# Patient Record
Sex: Female | Born: 1960 | Marital: Married | Smoking: Never smoker
Health system: Southern US, Community
[De-identification: ages and names within clinical notes are randomized; demographics above are authoritative.]

## PROBLEM LIST (undated history)

## (undated) DIAGNOSIS — E213 Hyperparathyroidism, unspecified: Secondary | ICD-10-CM

## (undated) DIAGNOSIS — G8929 Other chronic pain: Secondary | ICD-10-CM

## (undated) DIAGNOSIS — F32A Depression, unspecified: Secondary | ICD-10-CM

## (undated) DIAGNOSIS — K219 Gastro-esophageal reflux disease without esophagitis: Secondary | ICD-10-CM

## (undated) DIAGNOSIS — N809 Endometriosis, unspecified: Secondary | ICD-10-CM

## (undated) DIAGNOSIS — M1990 Unspecified osteoarthritis, unspecified site: Secondary | ICD-10-CM

## (undated) DIAGNOSIS — I10 Essential (primary) hypertension: Secondary | ICD-10-CM

## (undated) DIAGNOSIS — E669 Obesity, unspecified: Secondary | ICD-10-CM

## (undated) DIAGNOSIS — E119 Type 2 diabetes mellitus without complications: Secondary | ICD-10-CM

## (undated) DIAGNOSIS — M792 Neuralgia and neuritis, unspecified: Secondary | ICD-10-CM

## (undated) DIAGNOSIS — E7800 Pure hypercholesterolemia, unspecified: Secondary | ICD-10-CM

## (undated) DIAGNOSIS — C801 Malignant (primary) neoplasm, unspecified: Secondary | ICD-10-CM

## (undated) DIAGNOSIS — F419 Anxiety disorder, unspecified: Secondary | ICD-10-CM

## (undated) DIAGNOSIS — T8859XA Other complications of anesthesia, initial encounter: Secondary | ICD-10-CM

## (undated) DIAGNOSIS — T4145XA Adverse effect of unspecified anesthetic, initial encounter: Secondary | ICD-10-CM

## (undated) DIAGNOSIS — J45909 Unspecified asthma, uncomplicated: Secondary | ICD-10-CM

## (undated) DIAGNOSIS — R102 Pelvic and perineal pain: Secondary | ICD-10-CM

## (undated) HISTORY — DX: Type 2 diabetes mellitus without complications: E11.9

## (undated) HISTORY — PX: FOOT SURGERY: SHX648

## (undated) HISTORY — DX: Neuralgia and neuritis, unspecified: M79.2

## (undated) HISTORY — DX: Pelvic and perineal pain: R10.2

## (undated) HISTORY — PX: LAPAROSCOPIC LYSIS OF ADHESIONS: SHX5905

## (undated) HISTORY — PX: SALPINGECTOMY: SHX328

## (undated) HISTORY — PX: BREAST BIOPSY: SHX20

## (undated) HISTORY — PX: WISDOM TOOTH EXTRACTION: SHX21

## (undated) HISTORY — DX: Other chronic pain: G89.29

## (undated) HISTORY — PX: LAPAROTOMY: SHX154

## (undated) HISTORY — DX: Essential (primary) hypertension: I10

## (undated) HISTORY — DX: Obesity, unspecified: E66.9

## (undated) HISTORY — DX: Endometriosis, unspecified: N80.9

---

## 1999-11-06 ENCOUNTER — Encounter: Admission: RE | Admit: 1999-11-06 | Discharge: 1999-11-06 | Payer: Self-pay | Admitting: Obstetrics and Gynecology

## 1999-11-06 ENCOUNTER — Encounter: Payer: Self-pay | Admitting: Obstetrics and Gynecology

## 2000-08-11 ENCOUNTER — Ambulatory Visit (HOSPITAL_COMMUNITY): Admission: RE | Admit: 2000-08-11 | Discharge: 2000-08-11 | Payer: Self-pay

## 2000-11-15 ENCOUNTER — Other Ambulatory Visit: Admission: RE | Admit: 2000-11-15 | Discharge: 2000-11-15 | Payer: Self-pay | Admitting: Obstetrics and Gynecology

## 2000-11-29 ENCOUNTER — Encounter (INDEPENDENT_AMBULATORY_CARE_PROVIDER_SITE_OTHER): Payer: Self-pay | Admitting: Specialist

## 2000-11-29 ENCOUNTER — Inpatient Hospital Stay (HOSPITAL_COMMUNITY): Admission: RE | Admit: 2000-11-29 | Discharge: 2000-12-02 | Payer: Self-pay | Admitting: Obstetrics and Gynecology

## 2002-02-01 ENCOUNTER — Other Ambulatory Visit: Admission: RE | Admit: 2002-02-01 | Discharge: 2002-02-01 | Payer: Self-pay | Admitting: Obstetrics and Gynecology

## 2003-04-25 ENCOUNTER — Other Ambulatory Visit: Admission: RE | Admit: 2003-04-25 | Discharge: 2003-04-25 | Payer: Self-pay | Admitting: Obstetrics and Gynecology

## 2004-05-21 ENCOUNTER — Other Ambulatory Visit: Admission: RE | Admit: 2004-05-21 | Discharge: 2004-05-21 | Payer: Self-pay | Admitting: Obstetrics and Gynecology

## 2009-05-28 ENCOUNTER — Ambulatory Visit: Payer: Self-pay | Admitting: Obstetrics & Gynecology

## 2009-05-28 ENCOUNTER — Inpatient Hospital Stay (HOSPITAL_COMMUNITY): Admission: EM | Admit: 2009-05-28 | Discharge: 2009-05-31 | Payer: Self-pay | Admitting: Emergency Medicine

## 2009-05-29 ENCOUNTER — Encounter (INDEPENDENT_AMBULATORY_CARE_PROVIDER_SITE_OTHER): Payer: Self-pay | Admitting: Emergency Medicine

## 2010-02-09 ENCOUNTER — Encounter: Payer: Self-pay | Admitting: Obstetrics & Gynecology

## 2010-02-09 ENCOUNTER — Encounter: Payer: Self-pay | Admitting: Obstetrics and Gynecology

## 2010-02-19 ENCOUNTER — Ambulatory Visit: Payer: Self-pay | Admitting: Family Medicine

## 2010-03-20 ENCOUNTER — Ambulatory Visit: Payer: Self-pay | Admitting: Family Medicine

## 2010-04-08 LAB — DIFFERENTIAL
Basophils Absolute: 0.1 10*3/uL (ref 0.0–0.1)
Eosinophils Absolute: 0.3 10*3/uL (ref 0.0–0.7)
Eosinophils Absolute: 0.3 10*3/uL (ref 0.0–0.7)
Eosinophils Absolute: 0.3 10*3/uL (ref 0.0–0.7)
Eosinophils Relative: 2 % (ref 0–5)
Lymphocytes Relative: 28 % (ref 12–46)
Lymphs Abs: 3.4 10*3/uL (ref 0.7–4.0)
Lymphs Abs: 3.1 10*3/uL (ref 0.7–4.0)
Lymphs Abs: 3.6 10*3/uL (ref 0.7–4.0)
Monocytes Relative: 6 % (ref 3–12)
Monocytes Relative: 7 % (ref 3–12)
Neutro Abs: 8.4 10*3/uL — ABNORMAL HIGH (ref 1.7–7.7)
Neutro Abs: 6.7 10*3/uL (ref 1.7–7.7)
Neutrophils Relative %: 65 % (ref 43–77)
Neutrophils Relative %: 61 % (ref 43–77)

## 2010-04-08 LAB — CARDIAC PANEL(CRET KIN+CKTOT+MB+TROPI)
CK, MB: 4.2 ng/mL — ABNORMAL HIGH (ref 0.3–4.0)
Total CK: 1766 U/L — ABNORMAL HIGH (ref 7–177)
Troponin I: 0.01 ng/mL (ref 0.00–0.06)

## 2010-04-08 LAB — BASIC METABOLIC PANEL
BUN: 13 mg/dL (ref 6–23)
Creatinine, Ser: 0.86 mg/dL (ref 0.4–1.2)
GFR calc non Af Amer: >60 mL/min (ref >60)
Glucose, Bld: 152 mg/dL — ABNORMAL HIGH (ref 70–99)
Potassium: 3.7 mEq/L (ref 3.5–5.1)

## 2010-04-08 LAB — CBC
HCT: 30.8 % — ABNORMAL LOW (ref 36.0–46.0)
HCT: 32.1 % — ABNORMAL LOW (ref 36.0–46.0)
HCT: 35.2 % — ABNORMAL LOW (ref 36.0–46.0)
Hemoglobin: 12.5 g/dL (ref 12.0–15.0)
Hemoglobin: 11.2 g/dL — ABNORMAL LOW (ref 12.0–15.0)
Hemoglobin: 12.1 g/dL (ref 12.0–15.0)
MCHC: 34.5 g/dL (ref 30.0–36.0)
MCHC: 35 g/dL (ref 30.0–36.0)
MCV: 96 fL (ref 78.0–100.0)
MCV: 96.3 fL (ref 78.0–100.0)
MCV: 96.3 fL (ref 78.0–100.0)
MCV: 96.8 fL (ref 78.0–100.0)
Platelets: 260 10*3/uL (ref 150–400)
Platelets: 191 10*3/uL (ref 150–400)
RBC: 3.78 MIL/uL — ABNORMAL LOW (ref 3.87–5.11)
RBC: 3.33 MIL/uL — ABNORMAL LOW (ref 3.87–5.11)
RDW: 13.7 % (ref 11.5–15.5)
WBC: 13 10*3/uL — ABNORMAL HIGH (ref 4.0–10.5)
WBC: 10.9 10*3/uL — ABNORMAL HIGH (ref 4.0–10.5)
WBC: 12.7 10*3/uL — ABNORMAL HIGH (ref 4.0–10.5)

## 2010-04-08 LAB — GLUCOSE, CAPILLARY
Glucose-Capillary: 250 mg/dL — ABNORMAL HIGH (ref 70–99)
Glucose-Capillary: 143 mg/dL — ABNORMAL HIGH (ref 70–99)
Glucose-Capillary: 151 mg/dL — ABNORMAL HIGH (ref 70–99)
Glucose-Capillary: 204 mg/dL — ABNORMAL HIGH (ref 70–99)

## 2010-04-08 LAB — COMPREHENSIVE METABOLIC PANEL
AST: 48 U/L — ABNORMAL HIGH (ref 0–37)
Albumin: 3.2 g/dL — ABNORMAL LOW (ref 3.5–5.2)
Alkaline Phosphatase: 52 U/L (ref 39–117)
BUN: 17 mg/dL (ref 6–23)
BUN: 22 mg/dL (ref 6–23)
CO2: 26 mEq/L (ref 19–32)
CO2: 27 mEq/L (ref 19–32)
Calcium: 9.4 mg/dL (ref 8.4–10.5)
Chloride: 100 mEq/L (ref 96–112)
Chloride: 105 mEq/L (ref 96–112)
Creatinine, Ser: 0.89 mg/dL (ref 0.4–1.2)
GFR calc Af Amer: 46 mL/min — ABNORMAL LOW (ref >60)
Glucose, Bld: 158 mg/dL — ABNORMAL HIGH (ref 70–99)
Potassium: 3.9 mEq/L (ref 3.5–5.1)
Sodium: 138 mEq/L (ref 135–145)
Total Bilirubin: 0.7 mg/dL (ref 0.3–1.2)
Total Protein: 6.6 g/dL (ref 6.0–8.3)

## 2010-04-08 LAB — URINALYSIS, ROUTINE W REFLEX MICROSCOPIC
Bilirubin Urine: NEGATIVE
Hgb urine dipstick: NEGATIVE
Ketones, ur: NEGATIVE mg/dL
Nitrite: NEGATIVE
Protein, ur: NEGATIVE mg/dL
Specific Gravity, Urine: 1.013 (ref 1.005–1.030)
pH: 5 (ref 5.0–8.0)

## 2010-04-08 LAB — TROPONIN I
Troponin I: 0.07 ng/mL — ABNORMAL HIGH (ref 0.00–0.06)
Troponin I: 0.02 ng/mL (ref 0.00–0.06)

## 2010-04-08 LAB — CULTURE, BLOOD (ROUTINE X 2): Culture: NO GROWTH

## 2010-04-08 LAB — APTT: aPTT: 35 seconds (ref 24–37)

## 2010-04-08 LAB — RAPID URINE DRUG SCREEN, HOSP PERFORMED
Barbiturates: NOT DETECTED
Benzodiazepines: POSITIVE — AB

## 2010-04-08 LAB — CK TOTAL AND CKMB (NOT AT ARMC)
CK, MB: 9.2 ng/mL (ref 0.3–4.0)
CK, MB: 7 ng/mL (ref 0.3–4.0)
Relative Index: 0.4 (ref 0.0–2.5)
Relative Index: 0.3 (ref 0.0–2.5)
Relative Index: 0.3 (ref 0.0–2.5)
Total CK: 2531 U/L — ABNORMAL HIGH (ref 7–177)
Total CK: 2217 U/L — ABNORMAL HIGH (ref 7–177)

## 2010-04-08 LAB — LIPID PANEL
Cholesterol: 137 mg/dL (ref 0–200)
HDL: 36 mg/dL — ABNORMAL LOW (ref >39)
LDL Cholesterol: 54 mg/dL (ref 0–99)
Triglycerides: 235 mg/dL — ABNORMAL HIGH (ref <150)

## 2010-04-08 LAB — PROTIME-INR
INR: 1.09 (ref 0.00–1.49)
INR: 1.14 (ref 0.00–1.49)

## 2010-04-08 LAB — LACTIC ACID, PLASMA: Lactic Acid, Venous: 2.5 mmol/L — ABNORMAL HIGH (ref 0.5–2.2)

## 2010-04-08 LAB — MRSA PCR SCREENING: MRSA by PCR: NEGATIVE

## 2010-04-08 LAB — HEMOGLOBIN A1C
Hgb A1c MFr Bld: 7.4 % — ABNORMAL HIGH (ref <5.7)
Mean Plasma Glucose: 166 mg/dL — ABNORMAL HIGH (ref <117)

## 2010-04-08 LAB — MAGNESIUM: Magnesium: 1.5 mg/dL (ref 1.5–2.5)

## 2010-04-08 LAB — CORTISOL: Cortisol, Plasma: 3.7 ug/dL

## 2010-04-08 LAB — TSH: TSH: 1.024 u[IU]/mL (ref 0.350–4.500)

## 2010-04-17 ENCOUNTER — Ambulatory Visit: Payer: Self-pay | Admitting: Physical Medicine and Rehabilitation

## 2010-04-20 ENCOUNTER — Ambulatory Visit: Payer: Self-pay | Admitting: Family Medicine

## 2010-05-22 ENCOUNTER — Other Ambulatory Visit: Payer: Self-pay | Admitting: Obstetrics and Gynecology

## 2010-05-22 DIAGNOSIS — Z1231 Encounter for screening mammogram for malignant neoplasm of breast: Secondary | ICD-10-CM

## 2010-05-26 ENCOUNTER — Ambulatory Visit: Payer: Self-pay

## 2010-06-06 NOTE — Op Note (Signed)
[facility]  Patient:    ISELLA, SLATTEN Visit Number: [id number] MRN: [id number]          Service Type: GYN Location: 9300 [id number] Attending Physician:  Esmeralda Arthur Dictated by:   Silverio Lay, M.D. Proc. Date: 11/29/00 Admit Date:  11/29/2000                             Operative Report  PREOPERATIVE DIAGNOSES:       1. Chronic pelvic pain.                               2. Severe abdominopelvic adhesion.                               3. Left hydrosalpinx.  POSTOPERATIVE DIAGNOSES:      1. Chronic pelvic pain.                               2. Severe abdominopelvic adhesion.                               3. Left ovarian cyst.  ANESTHESIA:                   General.  PROCEDURE:                    1. Exploratory laparotomy with lysis of                                  adhesions.                               2. Left ovarian cystectomy.                               3. Uterosacral nerve ablation.  SURGEON:                      Silverio Lay, M.D.  ASSISTANT:                    Pershing Cox, M.D.  ESTIMATED BLOOD LOSS:         200 cc.  DESCRIPTION OF PROCEDURE:     After being informed of the planned procedure with possible complications, including bleeding, infection, injury to bowels, bladder, or ureter, recurrent pain due to reformation of adhesions, and possible loss of her left adnexa, an informed consent was obtained.  The patient was taken to the operating room #3 and given general anesthesia with endotracheal intubation.  She was prepped and draped in a sterile fashion and a Foley catheter was inserted.  We proceeded with a midline incision overlooking the previous midline incision with knife to fascia.  The fascia was then opened first with the knife, then with the Mayo scissors.  At that time we encountered severe adhesions with the abdominal wall, which were sharply dissected until we could enter the abdominopelvic  cavity.  Overall about one hour was spent doing  lysis of adhesions, to have access to the cavity.  Self-retained retractors were placed and the bowel was retracted with abdominal lap.  Observation of the pelvis reveals a free anterior cul-de-sac, normal-appearing uterus, and free posterior cul-de-sac.  The right ovary is normal.  The right tube shows a partial middle part salpingectomy with a normal-appearing fimbria.  The left adnexa is completely involved in severe adhesion with the bowel, which we now sharply dissect, to free a tube which is diseased, folded on itself many times, and now a right ovary that contains an ovarian cyst measuring about 4.5 cm.  This cyst is adherent to the left pelvic wall and to the posterior cul-de-sac, and we are able to completely free it with sharp dissection.  The ureter is then visualized and mobile.  The cyst is opened, resected, and the capsule is removed.  The interior lining is cauterized, and the ovary is closed with a baseball stitch of #3-0 Vicryl. Hemostasis is adequate.  We then put our attention on the left tube, and tried to open the left tube, which is not dilated, on two occasions; but those occasions failed to reveal a lumen.  Some bleeding on the mesosalpinx is controlled with two figure-of-eight stitch of #3-0 Vicryl.  Using a fork and spoon clamp, we are able to mobilize the uterus upward, and to identify both uterosacral ligaments which are then sutured with a #0 Vicryl suture, staying midline and having the ureters under direct visualization.  Those sutures allow [place] to section the uterosacral ligaments using cautery.  Hemostasis is adequate.  Sutures are then sectioned.  We then irrigate the pelvis with warm saline and noted an adequate hemostasis.  We reverify hemostasis on the omentum which was greatly adherent to the abdominal wall and three sites are cauterized.  We then follow the small bowel and I will spend another 30 minutes  sharply dissecting adhesion and returning bowel to its normal course. We irrigate with warm saline.  Hemostasis is adequate.  A red Foley catheter is placed in the posterior cul-de-sac to outside, to allow [place] later to infuse the Inter-Gel.  The under-fasca hemostasis is then completed with cautery and the fascia is closed with interrupted sutures of #0 Vicryl.  We now infuse 300 cc of Inter-Gel two-thirds of which is via the red catheter in the posterior cul-de-sac, and one-third via the small catheter that comes with the Inter-Gel, to leave in the abdominal cavity.  We irrigate the fat and complete the hemostasis with cautery.  The skin is closed with staples.  The instrument and sponge counts are complete x 2.  The estimated blood loss was 200 cc.  The procedure is very well tolerated by the patient, who is taken to the recovery room in a well and stable condition. Dictated by:   Silverio Lay, M.D. Attending Physician:  Esmeralda Arthur DD:  11/29/00 TD:  11/29/00 Job: [id number]

## 2010-06-06 NOTE — H&P (Signed)
[facility]  Patient:    Gina Hill, Gina Hill Visit Number: [id number] MRN: [id number]          Service Type: Attending:  Silverio Lay, M.D. Dictated by:   Silverio Lay, M.D. Adm. Date:  11/29/00                           History and Physical  REASON FOR ADMISSION:         Chronic pelvic pain, severe pelvic adhesion syndrome.  HISTORY OF PRESENT ILLNESS:   This is a 50 year old, married black female, gravida 5, para 3, abortus 2, who has been followed by me since September 2001 for chronic pelvic pain and metrorrhagia. In July 2002, a diagnostic laparoscopy was performed but needed to be aborted due to the non feasibility of such procedure, patient having a completely obliterated pelvis with severe adhesions.  PAST SURGICAL HISTORY:        She is known for a previous laparotomy in September of 1998 with lysis of adhesions, bilateral tubal ligation ovarian lysis of adhesions and left fibroplasty with a diagnosis at that time of chronic PID. In January of 1999 she had partial right salpingectomy with laparotomy for ectopic pregnancy. In April of 1999 a hysterogram revealed bilateral hydrosalpinx. 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 She has undergone a trial of continuous birth control pills twice as well as Depo-Provera 150 mg monthly; last injection received in April 2002 and despite amenorrhea, pelvic pain remains the same. She describes it as almost constant pain which is increased with movement or physical activity, overall low abdomen of a severe intensity radiating to her legs and limiting her daily activities. It is also associated with dyspareunia.  In April of 2002, she went for consultation with Dr. ______ at [facility], who recommended three surgical options: Laparoscopy with  lysis of adhesions, laparoscopy with bilateral salpingectomy and right ovariectomy or laparoscopy with supracervical hysterectomy and BSO. The patient declines all options wanting to keep her organs and desiring not to go any ablation. She has had to use pain medication on a regular basis and is currently using Tylox b.i.d., Elavil 75 mg h.s., Paxil 60 mg q.d., and Zyprexa 2.5 mg q.d.  Due to the chronic nature of her pain and her desire to keep all her organs, we are consenting her today for exploratory laparotomy, lysis of adhesions with cure of left hydrosalpinx.  REVIEW OF SYSTEMS:            Shows increased weight in the last year of 60 pounds. HEENT: Eyes: Negative. Ear, nose, throat and mouth negative. CARDIOVASCULAR: Negative. Some shortness of breath with movement. Some nausea, vomiting and constipation with intermittent diarrhea. GENITOURINARY: Pain with intercourse. MUSCULOSKELETAL: Some joint pain. SKIN: Negative. BREASTS: Negative. NEUROLOGICAL: Migraine headaches. PSYCHIATRIC: Normal. ENDOCRINE: Hot flashes.  ALLERGIES:                    CODEINE.  PAST MEDICAL HISTORY:         Two spontaneous vaginal deliveries, one C-section, one D&C, one right partial salpingectomy in 1999 for ectopic pregnancy. Laparoscopy with lysis of adhesions in 1999. She has used an IUD from age 53 to 39.  SOCIAL HISTORY:  Married. Non smoker. She is a Futures trader.  FAMILY HISTORY:               Aunt with breast cancer in her late 37s.  Mother with hypertension and cardiovascular disease. Father with diabetes and prostate cancer.  PHYSICAL EXAMINATION:  VITAL SIGNS:                  Measuring 5 feet 7 inches, current weight is 241.8 pounds.  GENERAL:                      Well-developed, well-nourished, normal habitus.  NECK:                         Negative.  RESPIRATORY:                  Auscultation normal.  CARDIOVASCULAR:               Auscultation normal.  ABDOMEN:                       Distended but soft. No guarding. No rebound. Absence of hernia or hepatosplenomegaly.  LYMPH NODES:                  Negative in neck, axilla and groin area.  SKIN:                         Normal.  NEUROLOGICAL:                 The patient is well oriented in time, place and person with normal mood and affect.  BREASTS:                      Examination is normal.  GYNECOLOGIC:                  Examination reveals normal external genitalia and vaginal. Normal cervix, anterverted uterus which is slightly tender. Two adnexa which are mildly tender and difficulty to mobilize. Urethra is normal. Bladder is normal. Peritoneum is normal.  ASSESSMENT:                   Chronic pelvic pain and a woman known for severe pelvic adhesion and left hydrosalpinx requesting to preserve all her organs.  PLAN:                         The patient has been consented for exploratory laparotomy, lysis of adhesions, cure of left hydrosalpinx. This will be done through a midline incision. She is well aware of the risks of bowel injury, bladder injury, ureter injury, and will undergo complete bowel prep prior to surgery. She is also aware that lysis of adhesions is never guaranteed and due to the exploratory laparotomy may develop other adhesions but still declines permanent surgery. She is also aware that she may loose a tube or an ovary in the process of lysis of adhesions. Informed consent is obtained. Dictated by:   Silverio Lay, M.D. Attending:  Silverio Lay, M.D. DD:  11/22/00 TD:  11/23/00 Job: [id number]

## 2010-06-06 NOTE — Discharge Summary (Signed)
[facility]  Patient:    Gina Hill, GREENBAUM Visit Number: [id number] MRN: [id number]          Service Type: GYN Location: 9300 [id number] Attending Physician:  Esmeralda Arthur Dictated by:   Silverio Lay, M.D. Admit Date:  11/29/2000 Discharge Date: 12/02/2000                             Discharge Summary  REASON FOR ADMISSION:         Chronic pelvic pain, severe pelvic adhesion syndrome.  HISTORY OF PRESENT ILLNESS AND HOSPITAL COURSE:          This is a 50 year old married, black female, gravida 5, para 3, abortus 2, who had been followed by me since September 2001 for chronic pelvic pain and metrorrhagia. In July of 2001, a diagnostic laparoscopy had to be aborted due to the nonfeasibility of such procedure, pat having a completely obliterated pelvis with severe adhesions. On ultrasound evaluation preadmission, she was showing a left hydrosalpinx.  She was taken to the operating room and underwent exploratory laparotomy, complete lysis of adhesions, left ovarian cystectomy and uterosacral nerve ablation without any complication. Her operative estimated blood loss was 200 cc.  Pathology report from the ovarian cyst revealed a benign follicular cyst with fibrous adhesions.  Her postoperative course was uneventful. Postoperative hemoglobin remained stable at 8.4 and pain was very well controlled. On postoperative day #3, she reported normal bowel movement with passing gas. Diet was well tolerated and she was discharged home in a well and stable condition.  DISCHARGE MEDICATIONS:        1. Tylox one tablet every four to six hour                                  p.r.n., 30 tablets, no refills.                               2. Motrin 600 mg q.i.d. p.r.n., 30 tablets,                                  one refill.  DISCHARGE INSTRUCTIONS:       The patient was instructed to call if experiencing fever or increased pain.  DISCHARGE FOLLOWUP:            The patient will come back in the office on December 06, 2000 for staple removal and in four to six weeks for complete postoperative evaluation.  FINAL DIAGNOSES:              1. Chronic pelvic pain.                               2. Severe abdominopelvic adhesions.                               3. Left ovarian cyst.                               4. Status post exploratory laparotomy, lysis of  adhesions, left ovarian cystectomy and                                  uterosacral nerve ablation.  CONDITION AT DISCHARGE:       Well and stable. Dictated by:   Silverio Lay, M.D. Attending Physician:  Esmeralda Arthur DD:  12/15/00 TD:  12/16/00 Job: [id number]

## 2012-02-24 ENCOUNTER — Ambulatory Visit: Payer: Self-pay | Admitting: Obstetrics and Gynecology

## 2012-02-26 ENCOUNTER — Encounter: Payer: Self-pay | Admitting: Obstetrics and Gynecology

## 2012-02-26 ENCOUNTER — Ambulatory Visit: Payer: BC Managed Care – PPO | Admitting: Obstetrics and Gynecology

## 2012-02-26 VITALS — BP 128/90 | Ht 67.0 in | Wt 257.0 lb

## 2012-02-26 DIAGNOSIS — N809 Endometriosis, unspecified: Secondary | ICD-10-CM | POA: Insufficient documentation

## 2012-02-26 DIAGNOSIS — R102 Pelvic and perineal pain: Secondary | ICD-10-CM | POA: Insufficient documentation

## 2012-02-26 DIAGNOSIS — E119 Type 2 diabetes mellitus without complications: Secondary | ICD-10-CM | POA: Insufficient documentation

## 2012-02-26 DIAGNOSIS — I10 Essential (primary) hypertension: Secondary | ICD-10-CM | POA: Insufficient documentation

## 2012-02-26 DIAGNOSIS — R109 Unspecified abdominal pain: Secondary | ICD-10-CM

## 2012-02-26 DIAGNOSIS — Z124 Encounter for screening for malignant neoplasm of cervix: Secondary | ICD-10-CM

## 2012-02-26 DIAGNOSIS — Z01419 Encounter for gynecological examination (general) (routine) without abnormal findings: Secondary | ICD-10-CM

## 2012-02-26 DIAGNOSIS — G8929 Other chronic pain: Secondary | ICD-10-CM | POA: Insufficient documentation

## 2012-02-26 DIAGNOSIS — E669 Obesity, unspecified: Secondary | ICD-10-CM | POA: Insufficient documentation

## 2012-02-26 LAB — FOLLICLE STIMULATING HORMONE: FSH: 48.1 m[IU]/mL

## 2012-02-26 MED ORDER — OXYCODONE-ACETAMINOPHEN 5-500 MG PO CAPS: 1.0000 | ORAL_CAPSULE | ORAL | Status: DC | PRN

## 2012-02-26 MED ORDER — AMITRIPTYLINE HCL 75 MG PO TABS: 75.0000 mg | ORAL_TABLET | Freq: Every day | ORAL | Status: DC

## 2012-02-26 NOTE — Progress Notes (Signed)
The patient reports:has been cramping lately    Contraception:None  Last mammogram: 05/27/2004 Last pap: 05/22/2010 Normal  GC/Chlamydia cultures offered: declined HIV/RPR/HbsAg offered:  declined HSV 1 and 2 glycoprotein offered: declined  Menstrual cycle regular and monthly: No per pt does not recall last cycle  Menstrual flow normal:No   Urinary symptoms: urinary frequency Normal bowel movements: Yes Reports abuse at home: No:   Subjective:    Gina Hill is a 52 y.o. female, G4P3, who presents for an annual exam.     History   Social History  . Marital Status: Single    Spouse Name: N/A    Number of Children: N/A  . Years of Education: N/A   Social History Main Topics  . Smoking status: Not on file  . Smokeless tobacco: Not on file  . Alcohol Use: Not on file  . Drug Use: Not on file  . Sexually Active: Not on file   Other Topics Concern  . Not on file   Social History Narrative  . No narrative on file    Menstrual cycle:   LMP: No LMP recorded.           Cycle: amenorrhea, pt does c/o having menstrual cramping. States that the more movement she does the more pain she has. States that pain sits in the lower back and hip.   The following portions of the patient's history were reviewed and updated as appropriate: allergies, current medications, past family history, past medical history, past social history, past surgical history and problem list.  Review of Systems Pertinent items are noted in HPI. Breast:Negative for breast lump,nipple discharge or nipple retraction Gastrointestinal: Negative for abdominal pain, change in bowel habits or rectal bleeding Urinary:negative   Objective:    BP 128/90  Ht 5\' 7"  (1.702 m)  Wt 257 lb (116.574 kg)  BMI 40.25 kg/m2    Weight:  Wt Readings from Last 1 Encounters:  No data found for Wt          BMI: Body mass index is 40.25 kg/(m^2).  General Appearance: Alert, appropriate appearance for age. No acute  distress HEENT: Grossly normal Neck / Thyroid: Supple, no masses, nodes or enlargement Lungs: clear to auscultation bilaterally Back: No CVA tenderness Breast Exam: No masses or nodes.No dimpling, nipple retraction or discharge. Cardiovascular: Regular rate and rhythm. S1, S2, no murmur Gastrointestinal: Soft, tender, no masses or organomegaly diffusely tender as usual Pelvic Exam: Vulva and vagina appear normal. Bimanual exam reveals normal uterus and adnexa. Rectovaginal: normal rectal, no masses Lymphatic Exam: Non-palpable nodes in neck, clavicular, axillary, or inguinal regions  Skin: no rash or abnormalities Neurologic: Normal gait and speech, no tremor  Psychiatric: Alert and oriented, appropriate affect.    Assessment:    Normal gyn exam  Amenorrhea: menopause? FSH 2013=13.3 New onset of cramping   Plan:    pap smear  STD screening: declined Contraception:no method Cranberry Supplement Recommended  Mammogram Recommended / Pt declines  Transvaginal U/S for Abdominal Cramping @ [facility] Refilled Tylox and Flexeril   Silverio Lay MD

## 2012-02-29 LAB — PAP IG W/ RFLX HPV ASCU

## 2012-03-30 ENCOUNTER — Ambulatory Visit: Payer: BC Managed Care – PPO

## 2012-03-30 ENCOUNTER — Ambulatory Visit: Payer: BC Managed Care – PPO | Admitting: Obstetrics and Gynecology

## 2012-03-30 ENCOUNTER — Encounter: Payer: Self-pay | Admitting: Obstetrics and Gynecology

## 2012-03-30 ENCOUNTER — Other Ambulatory Visit: Payer: Self-pay | Admitting: Obstetrics and Gynecology

## 2012-03-30 VITALS — BP 132/80 | Temp 98.6°F | Resp 18 | Wt 260.0 lb

## 2012-03-30 DIAGNOSIS — R109 Unspecified abdominal pain: Secondary | ICD-10-CM

## 2012-03-30 DIAGNOSIS — N949 Unspecified condition associated with female genital organs and menstrual cycle: Secondary | ICD-10-CM

## 2012-03-30 NOTE — Progress Notes (Signed)
  Subjective:    Gina Hill is a 52 y.o. female, [phone], who presents for Gyn ultrasound because of CPP more like menstrual cramping despite no cycles.  The following portions of the patient's history were reviewed and updated as appropriate: allergies, current medications, past family history.  Objective:    BP 132/80  Temp(Src) 98.6 F (37 C) (Oral)  Resp 18  Wt 260 lb (117.935 kg)  BMI 40.71 kg/m2    Weight:  Wt Readings from Last 1 Encounters:  03/30/12 260 lb (117.935 kg)          BMI: Body mass index is 40.71 kg/(m^2).  ULTRASOUND: Uterus 7.8x6x6    Adnexa normal x2    Endometrium 4 mm    Free fluid: no    Other findings:  Fibroids anterior 7 cm (was 5 cm in 2007) FSH: 48  C/w menopause   Assessment:    Normal ovaries: reassurance  Uterine fibroid - known FSH c/w menopause   Plan:    Follow-up AEX  Silverio Lay MD

## 2013-01-09 ENCOUNTER — Ambulatory Visit: Payer: Self-pay | Admitting: Podiatry

## 2013-01-20 ENCOUNTER — Encounter: Payer: Self-pay | Admitting: Podiatry

## 2013-01-23 ENCOUNTER — Ambulatory Visit (INDEPENDENT_AMBULATORY_CARE_PROVIDER_SITE_OTHER): Payer: BC Managed Care – PPO

## 2013-01-23 ENCOUNTER — Encounter: Payer: Self-pay | Admitting: Podiatry

## 2013-01-23 ENCOUNTER — Ambulatory Visit (INDEPENDENT_AMBULATORY_CARE_PROVIDER_SITE_OTHER): Payer: BC Managed Care – PPO | Admitting: Podiatry

## 2013-01-23 VITALS — BP 136/93 | HR 89 | Resp 16 | Ht 67.0 in | Wt 240.0 lb

## 2013-01-23 DIAGNOSIS — M79609 Pain in unspecified limb: Secondary | ICD-10-CM

## 2013-01-23 DIAGNOSIS — M21619 Bunion of unspecified foot: Secondary | ICD-10-CM

## 2013-01-23 DIAGNOSIS — M79671 Pain in right foot: Secondary | ICD-10-CM

## 2013-01-23 DIAGNOSIS — M21612 Bunion of left foot: Secondary | ICD-10-CM

## 2013-01-23 NOTE — Progress Notes (Signed)
   Subjective:    Patient ID: Gina Hill, female    DOB: October 18, 1960, 53 y.o.   MRN: [id number]  HPI Comments: i have a toenail that falls off every winter and when it comes back it is dark and painful since i was younger, also i have this lump on the side of foot that hurts it gives me sharp pain , left foot it has been going on for a while .   Diabetes Hypoglycemia symptoms include confusion and nervousness/anxiousness. Associated symptoms include fatigue.      Review of Systems  Constitutional: Positive for appetite change and fatigue.  Gastrointestinal: Positive for nausea and abdominal pain.  Endocrine:       Diabetic since 2004-2005   Musculoskeletal: Positive for back pain.  Skin: Positive for rash.  Neurological: Positive for numbness.  Psychiatric/Behavioral: Positive for confusion. The patient is nervous/anxious.        Objective:   Physical Exam:. I have reviewed her past medical history medications allergies surgeries social history and review of systems. Currently she states her diabetes is under good control. Pulses are strongly palpable bilateral. Neurologic sensorium is decreased per since once the monofilament to the level of the midfoot. Deep tendon reflexes are elicitable. Muscle strength was 5 over 5 dorsiflexors plantar flexors inverters everters all intrinsic musculature is intact. Orthopedic evaluation Mr. is all joints distal to the ankle a full range of motion without crepitus with exception of the first metatarsophalangeal joint which does demonstrate hallux abductovalgus deformity as well as limitation on range of motion of the first metatarsophalangeal joint left. Radiographic evaluation confirms this with an increase in the first intermetatarsal angle and increase in the hallux abductus angle. Joint space narrowing is indicative of osteoarthritic changes. Cutaneous evaluation demonstrates supple well hydrated cutis bilateral she has lost and hallux nail of the  right foot the states that this is common and it happened yearly.        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 She signed Dr. pages of the consent form and she was dispensed a Cam Walker today. She also received paperwork and contact information for the surgery center and the anesthesia group.

## 2013-02-10 ENCOUNTER — Encounter: Payer: Self-pay | Admitting: Podiatry

## 2013-02-10 DIAGNOSIS — M2010 Hallux valgus (acquired), unspecified foot: Secondary | ICD-10-CM

## 2013-02-16 ENCOUNTER — Encounter: Payer: Self-pay | Admitting: *Deleted

## 2013-02-16 ENCOUNTER — Encounter: Payer: BC Managed Care – PPO | Admitting: Podiatry

## 2013-02-16 NOTE — Progress Notes (Signed)
Pt came in office for 1st post op visit, dos 1.22.15. Pt had appt on mon 2.2.15 and claimed it was for today. i did change pts bandage and it was soaking wet from a shower that she said she had today and also swollen. Pt stated she had taken her ace wrap off cause she got her foot wet today. i told pt she was not supposed to get it wet and she said she forgot. i told her to only take a bird bath and not to get it wet, stay off of foot, elevate and ice. Pt understood.

## 2013-02-16 NOTE — Progress Notes (Signed)
   Subjective:    Patient ID: Gina Hill, female    DOB: 09/26/1960, 53 y.o.   MRN: [id number]  HPI    Review of Systems     Objective:   Physical Exam        Assessment & Plan:

## 2013-02-20 ENCOUNTER — Ambulatory Visit (INDEPENDENT_AMBULATORY_CARE_PROVIDER_SITE_OTHER): Payer: BC Managed Care – PPO

## 2013-02-20 ENCOUNTER — Encounter: Payer: Self-pay | Admitting: Podiatry

## 2013-02-20 ENCOUNTER — Ambulatory Visit (INDEPENDENT_AMBULATORY_CARE_PROVIDER_SITE_OTHER): Payer: BC Managed Care – PPO | Admitting: Podiatry

## 2013-02-20 VITALS — BP 153/102 | HR 95 | Resp 16 | Ht 67.0 in | Wt 230.0 lb

## 2013-02-20 DIAGNOSIS — Z9889 Other specified postprocedural states: Secondary | ICD-10-CM

## 2013-02-20 NOTE — Progress Notes (Signed)
She presents today for followup of a bunion repair left foot just under 2 weeks. She get the bandage wet last week which had to be redressed. She denies fever chills nausea vomiting muscle aches and pains.  Objective: Vital signs are stable she is alert and oriented x3. She has moderate edema overlying the first metatarsophalangeal joint of the left foot. She has good range of motion without pain. Radiographic evaluation does demonstrate edema overlying the first metatarsophalangeal joint and lesser metatarsals.  Assessment: Well-healing surgical foot left. Encouraged range of motion exercises.  Plan: Redressed the foot today with a dry sterile compressive dressing followup with her in one week for suture removal. Encouraged range of motion exercises.

## 2013-02-22 ENCOUNTER — Telehealth: Payer: Self-pay | Admitting: *Deleted

## 2013-02-22 NOTE — Telephone Encounter (Signed)
Pt called wanting to know if she could take her bandage off, get her foot wet, if she needed to elevate, stay off foot and asked on how to do the exercises. Told pt to move toes back and forth with foot flexed, keep bandage on till next appt visit, do not get it wet, continue with elevation, and  will need to still stay off of foot. Pt understood.

## 2013-02-23 NOTE — Telephone Encounter (Signed)
See duplicate phone call

## 2013-02-28 ENCOUNTER — Telehealth: Payer: Self-pay | Admitting: *Deleted

## 2013-02-28 NOTE — Telephone Encounter (Signed)
Pt called wanting to know if her stitches were dissolvable so she can clean her foot. Told pt not to get her foot wet, do not put foot in water even if it were wrapped in bags. Told her she could sit on a chair and use cloth or q tips to clean in between toes and or heel area. She stated she wanted to take the bandage off to clean her foot. i stated once again not to get foot wet. She then said ok i understand. i didn't really understand before cause he didn't wrap my foot up like yall did. Pt did say she will not put her foot in water and will clean in between toes and heel.

## 2013-03-01 ENCOUNTER — Ambulatory Visit (INDEPENDENT_AMBULATORY_CARE_PROVIDER_SITE_OTHER): Payer: BC Managed Care – PPO | Admitting: Podiatry

## 2013-03-01 VITALS — BP 161/103 | HR 95 | Resp 16 | Ht 67.0 in | Wt 229.0 lb

## 2013-03-01 DIAGNOSIS — Z9889 Other specified postprocedural states: Secondary | ICD-10-CM

## 2013-03-01 NOTE — Progress Notes (Signed)
She presents today a little more than 2 weeks status post [facility] bunion repair left foot. She states that you know I had to wash under my dressing and Kling my toes up. She states that she still has tenderness on range of motion of the toe she denies fever chills nausea and vomiting. She denies trauma to the toe.  Objective: Vital signs are stable she is alert and oriented x3. There is no erythema cellulitis drainage or odor. She has postinflammatory hyperpigmentation about the first metatarsophalangeal joint with edema. Fantastic range of motion on dorsiflexion and plantar flexion. Margins are well coapted I see no signs of dehiscence.  Assessment: Well-healing surgical foot left. Status post [facility] bunion repair.  Plan: We put her in a compression anklet today and a Darco shoe. I suggested that she followup with me in 2 weeks for set of x-rays. She stated that she wanted to go to [place] for her birthday [birth year]. I expressed to her that she needs to see me before she leaves for [place]. However upon checking out she made her followup appointment greater than one month for now against my advice. X-rays will be performed when she returns

## 2013-04-03 ENCOUNTER — Encounter: Payer: BC Managed Care – PPO | Admitting: Podiatry

## 2013-04-05 ENCOUNTER — Ambulatory Visit: Payer: Self-pay

## 2013-04-13 ENCOUNTER — Ambulatory Visit (INDEPENDENT_AMBULATORY_CARE_PROVIDER_SITE_OTHER): Payer: BC Managed Care – PPO

## 2013-04-13 ENCOUNTER — Ambulatory Visit: Payer: BC Managed Care – PPO | Admitting: Podiatry

## 2013-04-13 ENCOUNTER — Ambulatory Visit: Payer: Self-pay | Admitting: Surgery

## 2013-04-13 VITALS — Resp 16 | Ht 67.0 in | Wt 237.0 lb

## 2013-04-13 DIAGNOSIS — M21612 Bunion of left foot: Secondary | ICD-10-CM

## 2013-04-13 DIAGNOSIS — Z9889 Other specified postprocedural states: Secondary | ICD-10-CM

## 2013-04-13 DIAGNOSIS — M79609 Pain in unspecified limb: Secondary | ICD-10-CM

## 2013-04-13 DIAGNOSIS — M21619 Bunion of unspecified foot: Secondary | ICD-10-CM

## 2013-04-13 NOTE — Progress Notes (Signed)
She presents today 2 months status post [facility] bunion repair left foot. She denies fever chills nausea vomiting muscle aches and pains. She states it seems to be doing pretty good.  Objective: Vital signs are stable she is alert and oriented x3. Mild edema about the surgical site. She has a great range of motion dorsiflexion and plantar flexion passive and active range of motion. Radiographic evaluation demonstrates some calcification surrounding the soft tissue of the first metatarsophalangeal joint possibly secondary to motion of the head of the first metatarsal osteotomy. Screws intact and the osteotomy appears to be stable.  Assessment: Well-healing surgical foot left.  Plan: Discussed etiology pathology conservative versus surgical therapies. I encouraged her to try to get into her regular pair shoes gear and will followup with her in 4-6 weeks for another set of x-rays.

## 2013-04-15 LAB — PATHOLOGY REPORT

## 2013-05-01 ENCOUNTER — Other Ambulatory Visit: Payer: Self-pay | Admitting: General Surgery

## 2013-05-01 DIAGNOSIS — N63 Unspecified lump in unspecified breast: Secondary | ICD-10-CM

## 2013-05-01 DIAGNOSIS — R897 Abnormal histological findings in specimens from other organs, systems and tissues: Secondary | ICD-10-CM

## 2013-05-05 ENCOUNTER — Ambulatory Visit
Admission: RE | Admit: 2013-05-05 | Discharge: 2013-05-05 | Disposition: A | Discharge status: Home / Self Care | Payer: Medicare Other | Source: Ambulatory Visit | Attending: General Surgery | Admitting: General Surgery

## 2013-05-05 DIAGNOSIS — N63 Unspecified lump in unspecified breast: Secondary | ICD-10-CM

## 2013-05-05 DIAGNOSIS — R897 Abnormal histological findings in specimens from other organs, systems and tissues: Secondary | ICD-10-CM

## 2013-05-08 ENCOUNTER — Ambulatory Visit (INDEPENDENT_AMBULATORY_CARE_PROVIDER_SITE_OTHER): Payer: BC Managed Care – PPO | Admitting: General Surgery

## 2013-05-08 ENCOUNTER — Encounter (INDEPENDENT_AMBULATORY_CARE_PROVIDER_SITE_OTHER): Payer: Self-pay | Admitting: General Surgery

## 2013-05-08 ENCOUNTER — Ambulatory Visit (INDEPENDENT_AMBULATORY_CARE_PROVIDER_SITE_OTHER): Payer: Self-pay | Admitting: General Surgery

## 2013-05-08 VITALS — BP 110/70 | HR 88 | Temp 97.0°F | Resp 16 | Ht 67.0 in | Wt 242.2 lb

## 2013-05-08 DIAGNOSIS — N63 Unspecified lump in unspecified breast: Secondary | ICD-10-CM

## 2013-05-08 DIAGNOSIS — N6310 Unspecified lump in the right breast, unspecified quadrant: Secondary | ICD-10-CM | POA: Insufficient documentation

## 2013-05-08 NOTE — Patient Instructions (Signed)
Will review with radiology and pathology

## 2013-05-12 ENCOUNTER — Inpatient Hospital Stay: Admission: RE | Admit: 2013-05-12 | Payer: BC Managed Care – PPO | Source: Ambulatory Visit

## 2013-05-12 ENCOUNTER — Telehealth (INDEPENDENT_AMBULATORY_CARE_PROVIDER_SITE_OTHER): Payer: Self-pay

## 2013-05-12 NOTE — Telephone Encounter (Signed)
Message copied by Carlene Coria on Fri May 12, 2013  4:04 PM ------      Message from: Luella Cook III      Created: Fri May 12, 2013  3:27 PM       I have not spoken to radiology yet. i did speak to pathology and they are going to review her slides. i thought i would start there because if they call it cancer then we know what to do next. PJ      ----- Message -----         From: Carlene Coria, CMA         Sent: 05/12/2013   1:31 PM           To: Merrie Roof, MD            Did you get to speak to radiologist regarding pts imaging? She is calling back asking.            Thx      MB       ------

## 2013-05-12 NOTE — Telephone Encounter (Signed)
Called pt and let her know Dr Marlou Starks has not spoke to radiologist yet but that the pathologist did request slides. We will have to await to hear from pathology. She asked what was the name her her mass that was found. 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 Dr Marlou Starks is wanting a second pathologist to look at slides to make sure it is indeed a papilloma and there is no cancer within the specimen. Patient thankful and would like a call back Monday with an update.

## 2013-05-15 ENCOUNTER — Other Ambulatory Visit: Payer: Self-pay | Admitting: *Deleted

## 2013-05-17 NOTE — Telephone Encounter (Signed)
Dr Marlou Starks called pt and gave her new path results. She will think things over and call us when she is ready to proceed with medical oncology appt.

## 2013-05-25 ENCOUNTER — Encounter: Payer: BC Managed Care – PPO | Admitting: Podiatry

## 2013-05-30 ENCOUNTER — Telehealth (INDEPENDENT_AMBULATORY_CARE_PROVIDER_SITE_OTHER): Payer: Self-pay | Admitting: *Deleted

## 2013-05-30 NOTE — Telephone Encounter (Signed)
Pt called said her and Dr. Marlou Starks has talked about her being referred to Oncology.  She said she has had time to take a breath and she is ready to go on with this.  Please advise!  Anderson Malta

## 2013-05-31 ENCOUNTER — Other Ambulatory Visit (INDEPENDENT_AMBULATORY_CARE_PROVIDER_SITE_OTHER): Payer: Self-pay

## 2013-05-31 DIAGNOSIS — C50911 Malignant neoplasm of unspecified site of right female breast: Secondary | ICD-10-CM

## 2013-05-31 NOTE — Progress Notes (Signed)
Patient ID: Gina Hill, female   DOB: 1960-12-17, 53 y.o.   MRN: [id number]  Chief Complaint  Patient presents with  . New Evaluation    eval  large breast mass right breast    HPI Gina Hill is a 53 y.o. female.  We are asked to see the patient in consultation by Dr. Netty Starring to evaluate her for a right breast mass. The patient is a 53 year old black female who first felt a mass in the upper portion of the right breast about a year ago. She finally went and had this biopsied at [facility]. Her pathology on this came back as atypical cells. She did see a Psychologist, sport and exercise at the [facility] but decided to come here for a second opinion. She denies any breast pain. She denies any discharge from her nipple. She does have a maternal and this had breast cancer.  HPI  Past Medical History  Diagnosis Date  . Obese   . Hypertension   . Diabetes mellitus without complication   . Endometriosis   . Chronic pelvic pain in female   . Neuropathic pain     Past Surgical History  Procedure Laterality Date  . Laparotomy    . Wisdom tooth extraction    . Salpingectomy      Partial Right    . Cesarean section    . Laparoscopic lysis of adhesions      Family History  Problem Relation Age of Onset  . Hypertension Mother   . Mental illness Mother   . Heart disease Mother   . Diabetes Father   . Hypertension Father   . Cancer Father     prostate  . Kidney disease Father   . Breast cancer Maternal Aunt   . Cancer Maternal Aunt     kidney    Social History History  Substance Use Topics  . Smoking status: Never Smoker   . Smokeless tobacco: Never Used  . Alcohol Use: No    Allergies  Allergen Reactions  . Codeine Nausea And Vomiting    Current Outpatient Prescriptions  Medication Sig Dispense Refill  . albuterol (PROVENTIL) (2.5 MG/3ML) 0.083% nebulizer solution Take 2.5 mg by nebulization every 6 (six) hours as needed for wheezing or shortness of breath.      Marland Kitchen amitriptyline  (ELAVIL) 75 MG tablet Take 1 tablet (75 mg total) by mouth at bedtime.  60 tablet  4  . Amlodipine-Valsartan-HCTZ 10-320-25 MG TABS       . CRESTOR 20 MG tablet       . cyclobenzaprine (FLEXERIL) 10 MG tablet       . diazepam (VALIUM) 5 MG tablet       . escitalopram (LEXAPRO) 10 MG tablet Take 20 mg by mouth daily.       Marland Kitchen glipiZIDE (GLUCOTROL) 5 MG tablet Take 5 mg by mouth daily before breakfast.      . l-methylfolate-B6-B12 (METANX) 3-35-2 MG TABS Take 1 tablet by mouth daily.      . Liraglutide (VICTOZA [place]) Inject into the skin.      Marland Kitchen omega-3 acid ethyl esters (LOVAZA) 1 G capsule Take 2 g by mouth 2 (two) times daily.      Marland Kitchen oxyCODONE-acetaminophen (PERCOCET) 10-325 MG per tablet       . pregabalin (LYRICA) 150 MG capsule Take 150 mg by mouth 2 (two) times daily.      . promethazine (PHENERGAN) 25 MG tablet       . tiZANidine (ZANAFLEX) 4  MG capsule Take 4 mg by mouth 3 (three) times daily.      Marland Kitchen topiramate (TOPAMAX) 25 MG tablet Take 50 mg by mouth 2 (two) times daily.       . hydrOXYzine (ATARAX/VISTARIL) 25 MG tablet Take 25 mg by mouth 3 (three) times daily as needed.      Marland Kitchen MEPERITAB 50 MG tablet        No current facility-administered medications for this visit.    Review of Systems Review of Systems  Constitutional: Negative.   HENT: Negative.   Eyes: Negative.   Respiratory: Negative.   Cardiovascular: Negative.   Gastrointestinal: Negative.   Endocrine: Negative.   Genitourinary: Negative.   Musculoskeletal: Negative.   Skin: Negative.   Allergic/Immunologic: Negative.   Neurological: Negative.   Hematological: Negative.   Psychiatric/Behavioral: Negative.     Blood pressure 110/70, pulse 88, temperature 97 F (36.1 C), temperature source Temporal, resp. rate 16, height 5\' 7"  (1.702 m), weight 242 lb 3.2 oz (109.861 kg).  Physical Exam Physical Exam  Constitutional: She is oriented to person, place, and time. She appears well-developed and well-nourished.   HENT:  Head: Normocephalic and atraumatic.  Eyes: Conjunctivae and EOM are normal. Pupils are equal, round, and reactive to light.  Neck: Normal range of motion. Neck supple.  Cardiovascular: Normal rate, regular rhythm and normal heart sounds.   Pulmonary/Chest: Effort normal and breath sounds normal.  She has a very large mass centrally in the right breast. It does feel mobile and not tethered to the chest wall. It encompasses a good portion of the breast. There is no palpable mass in the left breast. There is no palpable axillary, supraclavicular, or cervical lymphadenopathy  Abdominal: Soft. Bowel sounds are normal.  Musculoskeletal: Normal range of motion.  Lymphadenopathy:    She has no cervical adenopathy.  Neurological: She is alert and oriented to person, place, and time.  Skin: Skin is warm and dry.  Psychiatric: She has a normal mood and affect. Her behavior is normal.    Data Reviewed As above  Assessment    The patient has a large mass in the right breast that is certainly worrisome for breast cancer. She had a recent biopsy in [place] that was read as atypical.     Plan    At this point I think we are to have the pathology reread by our pathologist here. If she does have breast cancer then it is certainly large enough that she would probably benefit from neoadjuvant chemotherapy. If she chose not to have chemotherapy and if this does end up being a breast cancer then the most appropriate surgery for her would be a mastectomy with sentinel node mapping. I've discussed a lot of these possibilities with her. We will have our pathology department request the slides and then we will call her with the results of the study. She has made the comment that she is not going to let anybody remove her breast. Hopefully we'll be able to get her to come back and see [place] after we have a more definitive diagnosis.       Luella Cook III 05/31/2013, 1:26 PM

## 2013-06-08 ENCOUNTER — Encounter: Payer: Self-pay | Admitting: Podiatry

## 2013-06-08 ENCOUNTER — Telehealth (INDEPENDENT_AMBULATORY_CARE_PROVIDER_SITE_OTHER): Payer: Self-pay | Admitting: General Surgery

## 2013-06-08 NOTE — Telephone Encounter (Signed)
Rhonda at oncology  [phone] option 2 1  Wants to schedule pt for [facility] insert  Please call her

## 2013-06-09 ENCOUNTER — Other Ambulatory Visit (INDEPENDENT_AMBULATORY_CARE_PROVIDER_SITE_OTHER): Payer: Self-pay | Admitting: General Surgery

## 2013-06-09 NOTE — Telephone Encounter (Signed)
Called Gina Hill back with tentative date for 6/4

## 2013-06-15 ENCOUNTER — Ambulatory Visit
Admission: RE | Admit: 2013-06-15 | Discharge: 2013-06-15 | Disposition: A | Discharge status: Home / Self Care | Payer: Medicare Other | Source: Ambulatory Visit | Attending: General Surgery | Admitting: General Surgery

## 2013-06-15 MED ORDER — GADOBENATE DIMEGLUMINE 529 MG/ML IV SOLN
20.0000 mL | Freq: Once | INTRAVENOUS | Status: AC | PRN
  Administered 2013-06-15: 20 mL via INTRAVENOUS

## 2013-06-16 ENCOUNTER — Inpatient Hospital Stay (HOSPITAL_COMMUNITY)
Admission: RE | Admit: 2013-06-16 | Discharge: 2013-06-16 | Disposition: A | Discharge status: Home / Self Care | Payer: BC Managed Care – PPO | Source: Ambulatory Visit

## 2013-06-16 NOTE — Progress Notes (Signed)
No Show for PAT appt.  I tried calling the # [phone]--no mailbox set up.  DA

## 2013-06-21 ENCOUNTER — Encounter (HOSPITAL_COMMUNITY): Payer: Self-pay | Admitting: *Deleted

## 2013-06-21 ENCOUNTER — Inpatient Hospital Stay (HOSPITAL_COMMUNITY)
Admission: RE | Admit: 2013-06-21 | Discharge: 2013-06-21 | Disposition: A | Discharge status: Home / Self Care | Payer: BC Managed Care – PPO | Source: Ambulatory Visit

## 2013-06-21 MED ORDER — CEFAZOLIN SODIUM-DEXTROSE 2-3 GM-% IV SOLR
2.0000 g | INTRAVENOUS | Status: AC
  Administered 2013-06-22: 2 g via INTRAVENOUS
  Filled 2013-06-21: qty 50

## 2013-06-21 MED ORDER — CHLORHEXIDINE GLUCONATE 4 % EX LIQD
1.0000 | Freq: Once | CUTANEOUS | Status: DC
Start: 2013-06-22 — End: 2013-06-22
  Filled 2013-06-21: qty 15

## 2013-06-21 MED ORDER — CHLORHEXIDINE GLUCONATE 4 % EX LIQD
1.0000 "application " | Freq: Once | CUTANEOUS | Status: DC
  Filled 2013-06-21: qty 15

## 2013-06-21 NOTE — Progress Notes (Signed)
Pt no show for PAT, contacted on phone pt states"I am sick and don't do well with morning appts"  Will have scheduler contact pt in hopes to get appt later today.

## 2013-06-21 NOTE — Progress Notes (Signed)
06/21/13 1342  OBSTRUCTIVE SLEEP APNEA  Have you ever been diagnosed with sleep apnea through a sleep study? No  Do you snore loudly (loud enough to be heard through closed doors)?  1  Do you often feel tired, fatigued, or sleepy during the daytime? 1  Has anyone observed you stop breathing during your sleep? 0  Do you have, or are you being treated for high blood pressure? 1  BMI more than 35 kg/m2? 1  Age over 51 years old? 1  Gender: 0  Obstructive Sleep Apnea Score 5

## 2013-06-21 NOTE — Pre-Procedure Instructions (Signed)
Ducor  [birth year]   Your procedure is scheduled on:  June 22, 2013  Report to [facility] Admitting at 9 AM.  Call this number if you have problems the morning of surgery: [phone]   Remember:   Do not eat food or drink liquids after midnight.   Take these medicines the morning of surgery with A SIP OF WATER: albuterol (PROVENTIL) if needed, diazepam (VALIUM) if needed, escitalopram (LEXAPRO),  oxyCODONE-acetaminophen (PERCOCET), promethazine (PHENERGAN)    Do not wear jewelry, make-up or nail polish.  Do not wear lotions, powders, or perfumes. You may NOT wear deodorant.  Do not shave 48 hours prior to surgery.   Do not bring valuables to the hospital.  [facility] is not responsible for any belongings or valuables.               Contacts, dentures or bridgework may not be worn into surgery.  Leave suitcase in the car. After surgery it may be brought to your room.  For patients admitted to the hospital, discharge time is determined by your   treatment team.               Patients discharged the day of surgery will not be allowed to drive home.  Name and phone number of your driver:      Please read over the following fact sheets that you were given: Pain Booklet, Coughing and Deep Breathing and Surgical Site Infection Prevention

## 2013-06-22 ENCOUNTER — Encounter (HOSPITAL_COMMUNITY)
Admission: RE | Disposition: A | Discharge status: Home / Self Care | Payer: Self-pay | Source: Ambulatory Visit | Attending: General Surgery

## 2013-06-22 ENCOUNTER — Ambulatory Visit (HOSPITAL_COMMUNITY)
Admission: RE | Admit: 2013-06-22 | Discharge: 2013-06-22 | Disposition: A | Discharge status: Home / Self Care | Payer: BC Managed Care – PPO | Source: Ambulatory Visit | Attending: General Surgery | Admitting: General Surgery

## 2013-06-22 ENCOUNTER — Ambulatory Visit (HOSPITAL_COMMUNITY): Payer: BC Managed Care – PPO

## 2013-06-22 ENCOUNTER — Encounter (HOSPITAL_COMMUNITY): Payer: Self-pay | Admitting: *Deleted

## 2013-06-22 ENCOUNTER — Ambulatory Visit (HOSPITAL_COMMUNITY): Payer: BC Managed Care – PPO | Admitting: Anesthesiology

## 2013-06-22 ENCOUNTER — Encounter (HOSPITAL_COMMUNITY): Payer: BC Managed Care – PPO | Admitting: Anesthesiology

## 2013-06-22 DIAGNOSIS — I10 Essential (primary) hypertension: Secondary | ICD-10-CM | POA: Insufficient documentation

## 2013-06-22 DIAGNOSIS — C50911 Malignant neoplasm of unspecified site of right female breast: Secondary | ICD-10-CM

## 2013-06-22 DIAGNOSIS — E119 Type 2 diabetes mellitus without complications: Secondary | ICD-10-CM | POA: Insufficient documentation

## 2013-06-22 DIAGNOSIS — C50919 Malignant neoplasm of unspecified site of unspecified female breast: Secondary | ICD-10-CM | POA: Insufficient documentation

## 2013-06-22 DIAGNOSIS — Z6837 Body mass index (BMI) 37.0-37.9, adult: Secondary | ICD-10-CM | POA: Insufficient documentation

## 2013-06-22 DIAGNOSIS — E669 Obesity, unspecified: Secondary | ICD-10-CM | POA: Insufficient documentation

## 2013-06-22 DIAGNOSIS — Z885 Allergy status to narcotic agent status: Secondary | ICD-10-CM | POA: Insufficient documentation

## 2013-06-22 DIAGNOSIS — Z79899 Other long term (current) drug therapy: Secondary | ICD-10-CM | POA: Insufficient documentation

## 2013-06-22 HISTORY — DX: Gastro-esophageal reflux disease without esophagitis: K21.9

## 2013-06-22 HISTORY — DX: Malignant (primary) neoplasm, unspecified: C80.1

## 2013-06-22 HISTORY — DX: Hyperparathyroidism, unspecified: E21.3

## 2013-06-22 HISTORY — DX: Unspecified asthma, uncomplicated: J45.909

## 2013-06-22 HISTORY — DX: Other complications of anesthesia, initial encounter: T88.59XA

## 2013-06-22 HISTORY — DX: Unspecified osteoarthritis, unspecified site: M19.90

## 2013-06-22 HISTORY — DX: Pure hypercholesterolemia, unspecified: E78.00

## 2013-06-22 HISTORY — PX: PORTACATH PLACEMENT: SHX2246

## 2013-06-22 HISTORY — DX: Adverse effect of unspecified anesthetic, initial encounter: T41.45XA

## 2013-06-22 LAB — CBC
HCT: 35.8 % — ABNORMAL LOW (ref 36.0–46.0)
HEMOGLOBIN: 12.6 g/dL (ref 12.0–15.0)
MCH: 32.7 pg (ref 26.0–34.0)
MCHC: 35.2 g/dL (ref 30.0–36.0)
MCV: 93 fL (ref 78.0–100.0)
Platelets: 229 10*3/uL (ref 150–400)
RBC: 3.85 MIL/uL — AB (ref 3.87–5.11)
RDW: 13.9 % (ref 11.5–15.5)
WBC: 11 10*3/uL — AB (ref 4.0–10.5)

## 2013-06-22 LAB — BASIC METABOLIC PANEL
BUN: 11 mg/dL (ref 6–23)
CO2: 23 mEq/L (ref 19–32)
Calcium: 10.8 mg/dL — ABNORMAL HIGH (ref 8.4–10.5)
Chloride: 110 mEq/L (ref 96–112)
Creatinine, Ser: 0.85 mg/dL (ref 0.50–1.10)
GFR, EST NON AFRICAN AMERICAN: 78 mL/min — AB (ref >90)
Glucose, Bld: 86 mg/dL (ref 70–99)
POTASSIUM: 4.3 meq/L (ref 3.7–5.3)
SODIUM: 145 meq/L (ref 137–147)

## 2013-06-22 LAB — GLUCOSE, CAPILLARY
Glucose-Capillary: 92 mg/dL (ref 70–99)
Glucose-Capillary: 80 mg/dL (ref 70–99)

## 2013-06-22 SURGERY — INSERTION, TUNNELED CENTRAL VENOUS DEVICE, WITH PORT
Anesthesia: General | Site: Chest | Laterality: Left

## 2013-06-22 MED ORDER — MIDAZOLAM HCL 5 MG/5ML IJ SOLN
INTRAMUSCULAR | Status: DC | PRN
  Administered 2013-06-22: 2 mg via INTRAVENOUS

## 2013-06-22 MED ORDER — PROMETHAZINE HCL 25 MG/ML IJ SOLN: 6.2500 mg | INTRAMUSCULAR | Status: DC | PRN

## 2013-06-22 MED ORDER — HEPARIN SOD (PORK) LOCK FLUSH 100 UNIT/ML IV SOLN
INTRAVENOUS | Status: DC | PRN
  Administered 2013-06-22: 500 [IU] via INTRAVENOUS

## 2013-06-22 MED ORDER — OXYCODONE-ACETAMINOPHEN 5-325 MG PO TABS
ORAL_TABLET | ORAL | Status: AC
  Filled 2013-06-22: qty 2

## 2013-06-22 MED ORDER — BUPIVACAINE HCL (PF) 0.25 % IJ SOLN
INTRAMUSCULAR | Status: AC
  Filled 2013-06-22: qty 30

## 2013-06-22 MED ORDER — PROPOFOL 10 MG/ML IV BOLUS
INTRAVENOUS | Status: AC
  Filled 2013-06-22: qty 20

## 2013-06-22 MED ORDER — HEPARIN SOD (PORK) LOCK FLUSH 100 UNIT/ML IV SOLN
INTRAVENOUS | Status: AC
  Filled 2013-06-22: qty 5

## 2013-06-22 MED ORDER — HYDROMORPHONE HCL PF 1 MG/ML IJ SOLN: 0.2500 mg | INTRAMUSCULAR | Status: DC | PRN

## 2013-06-22 MED ORDER — MIDAZOLAM HCL 2 MG/2ML IJ SOLN
INTRAMUSCULAR | Status: AC
  Filled 2013-06-22: qty 2

## 2013-06-22 MED ORDER — LIDOCAINE HCL (CARDIAC) 20 MG/ML IV SOLN
INTRAVENOUS | Status: DC | PRN
  Administered 2013-06-22: 80 mg via INTRAVENOUS

## 2013-06-22 MED ORDER — BUPIVACAINE HCL (PF) 0.25 % IJ SOLN
INTRAMUSCULAR | Status: DC | PRN
  Administered 2013-06-22: 9 mL

## 2013-06-22 MED ORDER — SODIUM CHLORIDE 0.9 % IR SOLN
Status: DC | PRN
  Administered 2013-06-22: 08:00:00

## 2013-06-22 MED ORDER — FENTANYL CITRATE 0.05 MG/ML IJ SOLN
INTRAMUSCULAR | Status: DC | PRN
  Administered 2013-06-22 (×3): 50 ug via INTRAVENOUS

## 2013-06-22 MED ORDER — ONDANSETRON HCL 4 MG/2ML IJ SOLN
INTRAMUSCULAR | Status: DC | PRN
  Administered 2013-06-22: 4 mg via INTRAVENOUS

## 2013-06-22 MED ORDER — ONDANSETRON HCL 4 MG/2ML IJ SOLN
INTRAMUSCULAR | Status: AC
  Filled 2013-06-22: qty 2

## 2013-06-22 MED ORDER — OXYCODONE-ACETAMINOPHEN 5-325 MG PO TABS
1.0000 | ORAL_TABLET | Freq: Once | ORAL | Status: AC
  Administered 2013-06-22: 2 via ORAL

## 2013-06-22 MED ORDER — OXYCODONE-ACETAMINOPHEN 5-325 MG PO TABS: 1.0000 | ORAL_TABLET | ORAL | Status: DC | PRN

## 2013-06-22 MED ORDER — PROPOFOL 10 MG/ML IV BOLUS
INTRAVENOUS | Status: DC | PRN
Start: 2013-06-22 — End: 2013-06-22
  Administered 2013-06-22: 200 mg via INTRAVENOUS

## 2013-06-22 MED ORDER — FENTANYL CITRATE 0.05 MG/ML IJ SOLN
INTRAMUSCULAR | Status: AC
  Filled 2013-06-22: qty 5

## 2013-06-22 MED ORDER — LACTATED RINGERS IV SOLN
INTRAVENOUS | Status: DC | PRN
  Administered 2013-06-22: 07:00:00 via INTRAVENOUS

## 2013-06-22 SURGICAL SUPPLY — 52 items
ADH SKN CLS APL DERMABOND .7 (GAUZE/BANDAGES/DRESSINGS) ×1
BAG DECANTER FOR FLEXI CONT (MISCELLANEOUS) ×2 IMPLANT
CHLORAPREP W/TINT 10.5 ML (MISCELLANEOUS) ×2 IMPLANT
COVER SURGICAL LIGHT HANDLE (MISCELLANEOUS) ×2 IMPLANT
CRADLE DONUT ADULT HEAD (MISCELLANEOUS) ×2 IMPLANT
DECANTER SPIKE VIAL GLASS SM (MISCELLANEOUS) ×1 IMPLANT
DERMABOND ADVANCED (GAUZE/BANDAGES/DRESSINGS) ×1
DERMABOND ADVANCED .7 DNX12 (GAUZE/BANDAGES/DRESSINGS) ×1 IMPLANT
DRAPE C-ARM 42X72 X-RAY (DRAPES) ×2 IMPLANT
DRAPE CHEST BREAST 15X10 FENES (DRAPES) ×2 IMPLANT
DRAPE UTILITY 15X26 W/TAPE STR (DRAPE) ×4 IMPLANT
ELECT CAUTERY BLADE 6.4 (BLADE) ×2 IMPLANT
ELECT REM PT RETURN 9FT ADLT (ELECTROSURGICAL) ×2
ELECTRODE REM PT RTRN 9FT ADLT (ELECTROSURGICAL) ×1 IMPLANT
GAUZE SPONGE 4X4 16PLY XRAY LF (GAUZE/BANDAGES/DRESSINGS) ×2 IMPLANT
GLOVE BIO SURGEON STRL SZ7.5 (GLOVE) ×3 IMPLANT
GLOVE BIOGEL PI IND STRL 6.5 (GLOVE) IMPLANT
GLOVE BIOGEL PI IND STRL 7.5 (GLOVE) IMPLANT
GLOVE BIOGEL PI INDICATOR 6.5 (GLOVE) ×1
GLOVE BIOGEL PI INDICATOR 7.5 (GLOVE) ×1
GOWN STRL REUS W/ TWL LRG LVL3 (GOWN DISPOSABLE) ×2 IMPLANT
GOWN STRL REUS W/TWL LRG LVL3 (GOWN DISPOSABLE) ×4
INTRODUCER COOK 11FR (CATHETERS) IMPLANT
KIT BASIN OR (CUSTOM PROCEDURE TRAY) ×2 IMPLANT
KIT PORT POWER 8FR ISP CVUE (Catheter) ×1 IMPLANT
KIT PORT POWER 9.6FR MRI PREA (Catheter) IMPLANT
KIT PORT POWER ISP 8FR (Catheter) IMPLANT
KIT POWER CATH 8FR (Catheter) IMPLANT
KIT ROOM TURNOVER OR (KITS) ×2 IMPLANT
NDL HYPO 25GX1X1/2 BEV (NEEDLE) ×1 IMPLANT
NEEDLE 22X1 1/2 (OR ONLY) (NEEDLE) IMPLANT
NEEDLE HYPO 25GX1X1/2 BEV (NEEDLE) ×2 IMPLANT
NS IRRIG 1000ML POUR BTL (IV SOLUTION) ×2 IMPLANT
PACK SURGICAL SETUP 50X90 (CUSTOM PROCEDURE TRAY) ×2 IMPLANT
PAD ARMBOARD 7.5X6 YLW CONV (MISCELLANEOUS) ×2 IMPLANT
PENCIL BUTTON HOLSTER BLD 10FT (ELECTRODE) ×2 IMPLANT
SET INTRODUCER 12FR PACEMAKER (SHEATH) IMPLANT
SET SHEATH INTRODUCER 10FR (MISCELLANEOUS) IMPLANT
SHEATH COOK PEEL AWAY SET 9F (SHEATH) IMPLANT
SUT MNCRL AB 4-0 PS2 18 (SUTURE) ×2 IMPLANT
SUT PROLENE 2 0 SH 30 (SUTURE) ×4 IMPLANT
SUT SILK 2 0 (SUTURE)
SUT SILK 2-0 18XBRD TIE 12 (SUTURE) IMPLANT
SUT VIC AB 3-0 SH 27 (SUTURE) ×2
SUT VIC AB 3-0 SH 27XBRD (SUTURE) ×1 IMPLANT
SYR 20ML ECCENTRIC (SYRINGE) ×4 IMPLANT
SYR 5ML LUER SLIP (SYRINGE) ×2 IMPLANT
SYR CONTROL 10ML LL (SYRINGE) ×2 IMPLANT
SYRINGE 10CC LL (SYRINGE) IMPLANT
TOWEL OR 17X24 6PK STRL BLUE (TOWEL DISPOSABLE) ×2 IMPLANT
TOWEL OR 17X26 10 PK STRL BLUE (TOWEL DISPOSABLE) ×2 IMPLANT
TOWEL OR NON WOVEN STRL DISP B (DISPOSABLE) ×1 IMPLANT

## 2013-06-22 NOTE — H&P (View-Only) (Signed)
Patient ID: Gina Hill, female   DOB: 1960-12-17, 53 y.o.   MRN: [id number]  Chief Complaint  Patient presents with  . New Evaluation    eval  large breast mass right breast    HPI Gina Hill is a 53 y.o. female.  We are asked to see the patient in consultation by Dr. Netty Starring to evaluate her for a right breast mass. The patient is a 53 year old black female who first felt a mass in the upper portion of the right breast about a year ago. She finally went and had this biopsied at [facility]. Her pathology on this came back as atypical cells. She did see a Psychologist, sport and exercise at the [facility] but decided to come here for a second opinion. She denies any breast pain. She denies any discharge from her nipple. She does have a maternal and this had breast cancer.  HPI  Past Medical History  Diagnosis Date  . Obese   . Hypertension   . Diabetes mellitus without complication   . Endometriosis   . Chronic pelvic pain in female   . Neuropathic pain     Past Surgical History  Procedure Laterality Date  . Laparotomy    . Wisdom tooth extraction    . Salpingectomy      Partial Right    . Cesarean section    . Laparoscopic lysis of adhesions      Family History  Problem Relation Age of Onset  . Hypertension Mother   . Mental illness Mother   . Heart disease Mother   . Diabetes Father   . Hypertension Father   . Cancer Father     prostate  . Kidney disease Father   . Breast cancer Maternal Aunt   . Cancer Maternal Aunt     kidney    Social History History  Substance Use Topics  . Smoking status: Never Smoker   . Smokeless tobacco: Never Used  . Alcohol Use: No    Allergies  Allergen Reactions  . Codeine Nausea And Vomiting    Current Outpatient Prescriptions  Medication Sig Dispense Refill  . albuterol (PROVENTIL) (2.5 MG/3ML) 0.083% nebulizer solution Take 2.5 mg by nebulization every 6 (six) hours as needed for wheezing or shortness of breath.      Marland Kitchen amitriptyline  (ELAVIL) 75 MG tablet Take 1 tablet (75 mg total) by mouth at bedtime.  60 tablet  4  . Amlodipine-Valsartan-HCTZ 10-320-25 MG TABS       . CRESTOR 20 MG tablet       . cyclobenzaprine (FLEXERIL) 10 MG tablet       . diazepam (VALIUM) 5 MG tablet       . escitalopram (LEXAPRO) 10 MG tablet Take 20 mg by mouth daily.       Marland Kitchen glipiZIDE (GLUCOTROL) 5 MG tablet Take 5 mg by mouth daily before breakfast.      . l-methylfolate-B6-B12 (METANX) 3-35-2 MG TABS Take 1 tablet by mouth daily.      . Liraglutide (VICTOZA [place]) Inject into the skin.      Marland Kitchen omega-3 acid ethyl esters (LOVAZA) 1 G capsule Take 2 g by mouth 2 (two) times daily.      Marland Kitchen oxyCODONE-acetaminophen (PERCOCET) 10-325 MG per tablet       . pregabalin (LYRICA) 150 MG capsule Take 150 mg by mouth 2 (two) times daily.      . promethazine (PHENERGAN) 25 MG tablet       . tiZANidine (ZANAFLEX) 4  MG capsule Take 4 mg by mouth 3 (three) times daily.      Marland Kitchen topiramate (TOPAMAX) 25 MG tablet Take 50 mg by mouth 2 (two) times daily.       . hydrOXYzine (ATARAX/VISTARIL) 25 MG tablet Take 25 mg by mouth 3 (three) times daily as needed.      Marland Kitchen MEPERITAB 50 MG tablet        No current facility-administered medications for this visit.    Review of Systems Review of Systems  Constitutional: Negative.   HENT: Negative.   Eyes: Negative.   Respiratory: Negative.   Cardiovascular: Negative.   Gastrointestinal: Negative.   Endocrine: Negative.   Genitourinary: Negative.   Musculoskeletal: Negative.   Skin: Negative.   Allergic/Immunologic: Negative.   Neurological: Negative.   Hematological: Negative.   Psychiatric/Behavioral: Negative.     Blood pressure 110/70, pulse 88, temperature 97 F (36.1 C), temperature source Temporal, resp. rate 16, height 5\' 7"  (1.702 m), weight 242 lb 3.2 oz (109.861 kg).  Physical Exam Physical Exam  Constitutional: She is oriented to person, place, and time. She appears well-developed and well-nourished.   HENT:  Head: Normocephalic and atraumatic.  Eyes: Conjunctivae and EOM are normal. Pupils are equal, round, and reactive to light.  Neck: Normal range of motion. Neck supple.  Cardiovascular: Normal rate, regular rhythm and normal heart sounds.   Pulmonary/Chest: Effort normal and breath sounds normal.  She has a very large mass centrally in the right breast. It does feel mobile and not tethered to the chest wall. It encompasses a good portion of the breast. There is no palpable mass in the left breast. There is no palpable axillary, supraclavicular, or cervical lymphadenopathy  Abdominal: Soft. Bowel sounds are normal.  Musculoskeletal: Normal range of motion.  Lymphadenopathy:    She has no cervical adenopathy.  Neurological: She is alert and oriented to person, place, and time.  Skin: Skin is warm and dry.  Psychiatric: She has a normal mood and affect. Her behavior is normal.    Data Reviewed As above  Assessment    The patient has a large mass in the right breast that is certainly worrisome for breast cancer. She had a recent biopsy in  that was read as atypical.     Plan    At this point I think we are to have the pathology reread by our pathologist here. If she does have breast cancer then it is certainly large enough that she would probably benefit from neoadjuvant chemotherapy. If she chose not to have chemotherapy and if this does end up being a breast cancer then the most appropriate surgery for her would be a mastectomy with sentinel node mapping. I've discussed a lot of these possibilities with her. We will have our pathology department request the slides and then we will call her with the results of the study. She has made the comment that she is not going to let anybody remove her breast. Hopefully we'll be able to get her to come back and see [place] after we have a more definitive diagnosis.       Luella Cook III 05/31/2013, 1:26 PM

## 2013-06-22 NOTE — Transfer of Care (Signed)
Immediate Anesthesia Transfer of Care Note  Patient: Gina Hill  Procedure(s) Performed: Procedure(s): INSERTION PORT-A-CATH (Left)  Patient Location: PACU  Anesthesia Type:General  Level of Consciousness: awake, alert , oriented and sedated  Airway & Oxygen Therapy: Patient Spontanous Breathing and Patient connected to nasal cannula oxygen  Post-op Assessment: Report given to PACU RN, Post -op Vital signs reviewed and stable and Patient moving all extremities  Post vital signs: Reviewed and stable  Complications: No apparent anesthesia complications

## 2013-06-22 NOTE — Anesthesia Postprocedure Evaluation (Signed)
  Anesthesia Post-op Note  Patient: Gina Hill  Procedure(s) Performed: Procedure(s): INSERTION PORT-A-CATH (Left)  Patient Location: PACU  Anesthesia Type:General  Level of Consciousness: awake and alert   Airway and Oxygen Therapy: Patient Spontanous Breathing  Post-op Pain: mild  Post-op Assessment: Post-op Vital signs reviewed  Post-op Vital Signs: stable  Last Vitals:  Filed Vitals:   06/22/13 0915  BP: 145/91  Pulse:   Temp:   Resp: 10    Complications: No apparent anesthesia complications

## 2013-06-22 NOTE — Anesthesia Preprocedure Evaluation (Signed)
Anesthesia Evaluation  Patient identified by MRN, date of birth, ID band Patient awake    Reviewed: Allergy & Precautions, H&P , Patient's Chart, lab work & pertinent test results  History of Anesthesia Complications (+) history of anesthetic complications  Airway Mallampati: II  Neck ROM: Full    Dental   Pulmonary asthma ,  breath sounds clear to auscultation        Cardiovascular hypertension, Rhythm:Regular Rate:Normal     Neuro/Psych    GI/Hepatic GERD-  ,  Endo/Other  diabetes  Renal/GU      Musculoskeletal   Abdominal (+) + obese,   Peds  Hematology   Anesthesia Other Findings   Reproductive/Obstetrics                           Anesthesia Physical Anesthesia Plan  ASA: III  Anesthesia Plan: General   Post-op Pain Management:    Induction: Intravenous  Airway Management Planned: LMA and Oral ETT  Additional Equipment:   Intra-op Plan:   Post-operative Plan: Extubation in OR  Informed Consent: I have reviewed the patients History and Physical, chart, labs and discussed the procedure including the risks, benefits and alternatives for the proposed anesthesia with the patient or authorized representative who has indicated his/her understanding and acceptance.   Dental advisory given  Plan Discussed with: CRNA and Surgeon  Anesthesia Plan Comments:         Anesthesia Quick Evaluation

## 2013-06-22 NOTE — Interval H&P Note (Signed)
History and Physical Interval Note:  0/01/7508 2:58 AM  Gina Hill  has presented today for surgery, with the diagnosis of right breast cancer  The various methods of treatment have been discussed with the patient and family. After consideration of risks, benefits and other options for treatment, the patient has consented to  Procedure(s): INSERTION PORT-A-CATH (N/A) as a surgical intervention .  The patient's history has been reviewed, patient examined, no change in status, stable for surgery.  I have reviewed the patient's chart and labs.  Questions were answered to the patient's satisfaction.     Luella Cook III

## 2013-06-22 NOTE — Op Note (Signed)
06/22/2013  8:32 AM  PATIENT:  Gina Hill  53 y.o. female  PRE-OPERATIVE DIAGNOSIS: right breast cancer  POST-OPERATIVE DIAGNOSIS:right  breast cancer  PROCEDURE:  Procedure(s): INSERTION PORT-A-CATH (Left)  SURGEON:  Surgeon(s) and Role:    * Merrie Roof, MD - Primary  PHYSICIAN ASSISTANT:   ASSISTANTS: none   ANESTHESIA:   general  EBL:     BLOOD ADMINISTERED:none  DRAINS: none   LOCAL MEDICATIONS USED:  MARCAINE     SPECIMEN:  No Specimen  DISPOSITION OF SPECIMEN:  N/A  COUNTS:  YES  TOURNIQUET:  * No tourniquets in log *  DICTATION: .Dragon Dictation After informed consent was obtained the patient was brought to the operating room and placed in the supine position on the operating room table. After adequate induction of general anesthesia a roll was placed between the patient's shoulder blades to extend the shoulder slightly. Her left neck and chest area were then prepped with ChloraPrep, allowed to dry, and draped in usual sterile manner. The patient was placed in Trendelenburg position. The area lateral to the bend of the clavicle and the left chest was infiltrated with quarter percent Marcaine. A small incision was made with the knife in this location. A large bore finder needle from the Port-A-Cath kit was used to slide beneath the bend of the clavicle on the left chest heading towards the sternal margin and in doing so we were able to access the left subclavian vein without difficulty. A wire was fed through the needle using the Seldinger technique without difficulty. The wire was confirmed in the central venous system using real-time fluoroscopy. Next the incision on the left chest was extended medially and laterally a short distance. This incision was carried to the skin and subcutaneous tissue sharply with the electrocautery. A pocket in the subcutaneous tissue was created inferior to the incision using blunt finger dissection and some sharp dissection with the  electrocautery. Once this was accomplished the tubing was attached to the reservoir. The reservoir was placed in the pocket and the length of the tubing was estimated using real-time fluoroscopy and was cut to the appropriate length. Next a sheath and dilator were placed over the wire also using the Seldinger technique without difficulty. The wire and dilator were then removed and the tubing was fed through the sheath as far as it could be fed and held in place while the sheath was gently cracked and separated. Another real-time fluoroscopy image showed the tip of the catheter to be in the superior vena cava. Next the tubing was permanently anchored to the reservoir. The reservoir was anchored in the pocket with 2 2-0 Prolene stitches. The port was aspirated and it aspirated blood easily. It was initially flushed with a dilute heparin solution and then with a more concentrated heparin solution. The subcutaneous tissue was closed over the port with interrupted 3-0 Vicryl stitches. The skin was then closed with a running 4-0 Monocryl subcuticular stitch. Dermabond dressings were applied. The patient tolerated the procedure well. At the end of the case all needle sponge and instrument counts were correct. The patient was then awakened and taken to recovery in stable condition.  PLAN OF CARE: Discharge to home after PACU  PATIENT DISPOSITION:  PACU - hemodynamically stable.   Delay start of Pharmacological VTE agent (>24hrs) due to surgical blood loss or risk of bleeding: not applicable

## 2013-06-22 NOTE — Anesthesia Procedure Notes (Signed)
Procedure Name: LMA Insertion Date/Time: 06/22/2013 7:38 AM Performed by: Scheryl Darter Pre-anesthesia Checklist: Patient identified, Emergency Drugs available, Suction available, Patient being monitored and Timeout performed Patient Re-evaluated:Patient Re-evaluated prior to inductionOxygen Delivery Method: Circle system utilized Preoxygenation: Pre-oxygenation with 100% oxygen Intubation Type: IV induction Ventilation: Mask ventilation without difficulty LMA: LMA inserted LMA Size: 4.0 Number of attempts: 1 Placement Confirmation: positive ETCO2 and breath sounds checked- equal and bilateral Tube secured with: Tape Dental Injury: Teeth and Oropharynx as per pre-operative assessment

## 2013-06-22 NOTE — Progress Notes (Signed)
Patient requesting to have a dose of pain pills before she leaves due to issues with getting Rx filled quickly. Dr. Orene Desanctis notified and states she could have one time dose per Dr. Ethlyn Gallery Rx written for discharge.

## 2013-06-22 NOTE — Discharge Instructions (Signed)

## 2013-06-23 ENCOUNTER — Encounter (HOSPITAL_COMMUNITY): Payer: Self-pay | Admitting: General Surgery

## 2013-06-29 ENCOUNTER — Encounter: Payer: Self-pay | Admitting: Podiatry

## 2013-08-01 ENCOUNTER — Other Ambulatory Visit: Payer: Self-pay | Admitting: Obstetrics and Gynecology

## 2013-09-27 ENCOUNTER — Ambulatory Visit (INDEPENDENT_AMBULATORY_CARE_PROVIDER_SITE_OTHER): Payer: BC Managed Care – PPO

## 2013-09-27 ENCOUNTER — Ambulatory Visit: Payer: BC Managed Care – PPO | Admitting: Podiatry

## 2013-09-27 VITALS — BP 114/74 | HR 135 | Resp 16

## 2013-09-27 DIAGNOSIS — M79609 Pain in unspecified limb: Secondary | ICD-10-CM

## 2013-09-27 DIAGNOSIS — M21619 Bunion of unspecified foot: Secondary | ICD-10-CM

## 2013-09-27 DIAGNOSIS — M722 Plantar fascial fibromatosis: Secondary | ICD-10-CM

## 2013-09-27 DIAGNOSIS — M21612 Bunion of left foot: Secondary | ICD-10-CM

## 2013-09-27 DIAGNOSIS — G5760 Lesion of plantar nerve, unspecified lower limb: Secondary | ICD-10-CM

## 2013-09-27 DIAGNOSIS — G5782 Other specified mononeuropathies of left lower limb: Secondary | ICD-10-CM

## 2013-09-27 NOTE — Progress Notes (Signed)
She presents today complaining of pain to her left heel and her third toe of her left foot. States that she's recently been diagnosed with breast cancer and is currently taking chemotherapy. She denies any trauma to the left foot.  Objective: Vital signs are stable she is alert and oriented x3. Pulses are strongly palpable left foot. Pain on palpation medial calcaneal tubercle of the left heel. Radiographic evaluation does not demonstrate any type of osseous abnormalities of the previous bunion surgery and a soft tissue increase in density at the plantar fascial calcaneal insertion site indicative of plantar fasciitis. Hammertoe deformity of the third toe demonstrates a reactive hypertrophic nail.  Assessment: Plantar fasciitis left foot nail dystrophy third digit left foot.  Plan: Due to the left foot today with Kenalog and local anesthetic placed her in a night splint discussed appropriate shoe gear stretching exercises ice therapy and shoe gear modifications. I debrided the nail for her today. Followup with her in one month possible.

## 2013-10-04 ENCOUNTER — Telehealth: Payer: Self-pay | Admitting: *Deleted

## 2013-10-04 NOTE — Telephone Encounter (Signed)
Saw dr Milinda Pointer , and i am still in just a much pain as i was in when i was here, i must have missed something. Please call

## 2013-10-04 NOTE — Telephone Encounter (Signed)
CALLED PATIENT BACK AND SHE STATED THAT SHE DID NOT CALL THIS MORNING, SHE CALLED LAST WEEK AND HER QUESTION WAS ANSWERED LAST WEEK AND EVERYTHING IS FINE , THE PAIN HAS SUBSIDED. WILL SEE HER IN A COUPLE WEEKS

## 2013-10-25 ENCOUNTER — Ambulatory Visit: Payer: BC Managed Care – PPO | Admitting: Podiatry

## 2013-11-03 ENCOUNTER — Other Ambulatory Visit: Payer: Self-pay

## 2013-11-08 ENCOUNTER — Ambulatory Visit (INDEPENDENT_AMBULATORY_CARE_PROVIDER_SITE_OTHER): Payer: BC Managed Care – PPO | Admitting: Podiatry

## 2013-11-08 ENCOUNTER — Encounter: Payer: Self-pay | Admitting: Podiatry

## 2013-11-08 VITALS — BP 112/70 | HR 100 | Resp 16

## 2013-11-08 DIAGNOSIS — M722 Plantar fascial fibromatosis: Secondary | ICD-10-CM

## 2013-11-08 DIAGNOSIS — M21612 Bunion of left foot: Secondary | ICD-10-CM

## 2013-11-08 DIAGNOSIS — M2012 Hallux valgus (acquired), left foot: Secondary | ICD-10-CM

## 2013-11-08 NOTE — Progress Notes (Signed)
She presents today with left foot pain particularly in the forefoot and some residual plantar fasciitis her left heel. She states that the forefoot is really bothering her more she's been peeling skin from the bottom of the foot. She also turned her foot and ankle while cooking grits. She states that her primary doctors taking care of that.  Objective: Vital signs are stable alert and oriented x3. Pulses are palpable bilateral. She has areas of tears on the plantar aspect of the left foot where she's been cutting at the skin and carried weight skin on the bottom of the foot. This appears to be exquisitely tender I see no signs of infection however.  Assessment: Plantar fasciitis forefoot metatarsalgia and lacerations and excoriations plantar aspect of the left foot.  Plan: Epsom salts and water soaks continue application of Bactroban ointment and followup with me in 2-3 weeks.

## 2013-11-20 ENCOUNTER — Encounter: Payer: Self-pay | Admitting: Podiatry

## 2013-11-29 ENCOUNTER — Ambulatory Visit: Payer: BC Managed Care – PPO | Admitting: Podiatry

## 2014-07-16 ENCOUNTER — Other Ambulatory Visit: Payer: Self-pay

## 2015-04-08 ENCOUNTER — Other Ambulatory Visit: Payer: Self-pay | Admitting: Physician Assistant

## 2015-04-08 DIAGNOSIS — C50911 Malignant neoplasm of unspecified site of right female breast: Secondary | ICD-10-CM

## 2015-04-23 ENCOUNTER — Inpatient Hospital Stay: Payer: Medicare Other | Admitting: Oncology

## 2015-04-29 ENCOUNTER — Encounter: Payer: Self-pay | Admitting: *Deleted

## 2015-04-29 NOTE — Progress Notes (Signed)
  Oncology Nurse Navigator Documentation  Navigator Location: [facility] (04/29/15 1500) Navigator Encounter Type: Introductory phone call (04/29/15 1500)       Surgery Date: 09/19/14 (04/29/15 1500) Treatment Initiated Date: 09/19/14 (04/29/15 1500) Patient Visit Type: MedOnc (04/29/15 1500)   Barriers/Navigation Needs: Coordination of Care (04/29/15 1500)                          Time Spent with Patient: 30 (04/29/15 1500)   Called patient to introduce to navigation services.  She has an appointment with Dr. Grayland Ormond tomorrow.  States she had a history of breast cancer in 2012 and had a mastectomy on September 19, 2014 at [facility] in [place], [place] on the same breast.  States she refused antihormonal therapy, and "she wouldn't give me chemo".  Also states she has a port-a-cath that needs to be flushed.  Encouraged her to discuss with Dr. Grayland Ormond tomorrow.

## 2015-04-30 ENCOUNTER — Inpatient Hospital Stay: Payer: Medicare Other | Admitting: Oncology

## 2015-07-19 ENCOUNTER — Ambulatory Visit: Admission: RE | Admit: 2015-07-19 | Payer: Medicare Other | Source: Ambulatory Visit

## 2015-07-19 ENCOUNTER — Other Ambulatory Visit: Payer: Medicare Other

## 2015-07-19 ENCOUNTER — Inpatient Hospital Stay: Admission: RE | Admit: 2015-07-19 | Payer: Medicare Other | Source: Ambulatory Visit

## 2015-10-30 ENCOUNTER — Encounter: Payer: Self-pay | Admitting: Hematology and Oncology

## 2015-10-30 ENCOUNTER — Telehealth: Payer: Self-pay | Admitting: Hematology and Oncology

## 2015-10-30 NOTE — Telephone Encounter (Signed)
Appt scheduled w/Gina Hill for 10/17@345pm . Pt voice

## 2015-11-05 ENCOUNTER — Other Ambulatory Visit: Payer: Medicare Other

## 2015-11-05 ENCOUNTER — Telehealth: Payer: Self-pay | Admitting: Hematology and Oncology

## 2015-11-05 ENCOUNTER — Ambulatory Visit: Payer: Medicare Other | Admitting: Hematology and Oncology

## 2015-11-05 DIAGNOSIS — C50411 Malignant neoplasm of upper-outer quadrant of right female breast: Secondary | ICD-10-CM | POA: Insufficient documentation

## 2015-11-05 NOTE — Telephone Encounter (Signed)
Pt as transportation issues and cancelled her appt for today. The social worker was contacted to assist and provide options to pt.  Gina Hill/Gina Hill would review options in that area for transportation) Pt decline and insisted on seeing Dr. Lindi Adie.  She was offer multiple appt dates/times: Monday 10/23 at 2:45 pm was put on hold for pt.  Pt will call in by the end of the week to confirm this appt date/time.

## 2016-03-30 ENCOUNTER — Ambulatory Visit: Payer: Medicare Other | Admitting: Physical Therapy

## 2016-04-13 ENCOUNTER — Ambulatory Visit: Payer: Medicare Other | Attending: Physical Therapy | Admitting: Physical Therapy

## 2017-09-26 ENCOUNTER — Other Ambulatory Visit: Payer: Self-pay | Admitting: Endocrinology

## 2018-10-01 ENCOUNTER — Inpatient Hospital Stay (HOSPITAL_COMMUNITY)
Admission: EM | Admit: 2018-10-01 | Discharge: 2018-10-13 | DRG: 177 | Disposition: A | Discharge status: Home Health | Payer: Medicare Other | Attending: Internal Medicine | Admitting: Internal Medicine

## 2018-10-01 ENCOUNTER — Emergency Department (HOSPITAL_COMMUNITY): Payer: Medicare Other

## 2018-10-01 ENCOUNTER — Other Ambulatory Visit: Payer: Self-pay

## 2018-10-01 ENCOUNTER — Encounter (HOSPITAL_COMMUNITY): Payer: Self-pay | Admitting: Emergency Medicine

## 2018-10-01 DIAGNOSIS — M48061 Spinal stenosis, lumbar region without neurogenic claudication: Secondary | ICD-10-CM | POA: Diagnosis present

## 2018-10-01 DIAGNOSIS — Z7984 Long term (current) use of oral hypoglycemic drugs: Secondary | ICD-10-CM

## 2018-10-01 DIAGNOSIS — J1289 Other viral pneumonia: Secondary | ICD-10-CM | POA: Diagnosis present

## 2018-10-01 DIAGNOSIS — Z6835 Body mass index (BMI) 35.0-35.9, adult: Secondary | ICD-10-CM | POA: Diagnosis not present

## 2018-10-01 DIAGNOSIS — Z853 Personal history of malignant neoplasm of breast: Secondary | ICD-10-CM

## 2018-10-01 DIAGNOSIS — I959 Hypotension, unspecified: Secondary | ICD-10-CM | POA: Diagnosis not present

## 2018-10-01 DIAGNOSIS — E876 Hypokalemia: Secondary | ICD-10-CM | POA: Diagnosis not present

## 2018-10-01 DIAGNOSIS — J069 Acute upper respiratory infection, unspecified: Secondary | ICD-10-CM | POA: Diagnosis not present

## 2018-10-01 DIAGNOSIS — Z9079 Acquired absence of other genital organ(s): Secondary | ICD-10-CM | POA: Diagnosis not present

## 2018-10-01 DIAGNOSIS — Z888 Allergy status to other drugs, medicaments and biological substances status: Secondary | ICD-10-CM

## 2018-10-01 DIAGNOSIS — E1140 Type 2 diabetes mellitus with diabetic neuropathy, unspecified: Secondary | ICD-10-CM | POA: Diagnosis present

## 2018-10-01 DIAGNOSIS — M5440 Lumbago with sciatica, unspecified side: Secondary | ICD-10-CM | POA: Diagnosis present

## 2018-10-01 DIAGNOSIS — Y92230 Patient room in hospital as the place of occurrence of the external cause: Secondary | ICD-10-CM | POA: Diagnosis not present

## 2018-10-01 DIAGNOSIS — Z79899 Other long term (current) drug therapy: Secondary | ICD-10-CM

## 2018-10-01 DIAGNOSIS — E7800 Pure hypercholesterolemia, unspecified: Secondary | ICD-10-CM | POA: Diagnosis present

## 2018-10-01 DIAGNOSIS — N809 Endometriosis, unspecified: Secondary | ICD-10-CM | POA: Diagnosis present

## 2018-10-01 DIAGNOSIS — R0902 Hypoxemia: Secondary | ICD-10-CM | POA: Diagnosis not present

## 2018-10-01 DIAGNOSIS — G9341 Metabolic encephalopathy: Secondary | ICD-10-CM | POA: Diagnosis not present

## 2018-10-01 DIAGNOSIS — I10 Essential (primary) hypertension: Secondary | ICD-10-CM | POA: Diagnosis present

## 2018-10-01 DIAGNOSIS — E11649 Type 2 diabetes mellitus with hypoglycemia without coma: Secondary | ICD-10-CM | POA: Diagnosis present

## 2018-10-01 DIAGNOSIS — A0839 Other viral enteritis: Secondary | ICD-10-CM | POA: Diagnosis present

## 2018-10-01 DIAGNOSIS — D649 Anemia, unspecified: Secondary | ICD-10-CM | POA: Diagnosis not present

## 2018-10-01 DIAGNOSIS — M1990 Unspecified osteoarthritis, unspecified site: Secondary | ICD-10-CM | POA: Diagnosis present

## 2018-10-01 DIAGNOSIS — F329 Major depressive disorder, single episode, unspecified: Secondary | ICD-10-CM | POA: Diagnosis present

## 2018-10-01 DIAGNOSIS — J9601 Acute respiratory failure with hypoxia: Secondary | ICD-10-CM | POA: Diagnosis present

## 2018-10-01 DIAGNOSIS — U071 COVID-19: Principal | ICD-10-CM | POA: Diagnosis present

## 2018-10-01 DIAGNOSIS — J9600 Acute respiratory failure, unspecified whether with hypoxia or hypercapnia: Secondary | ICD-10-CM | POA: Diagnosis present

## 2018-10-01 DIAGNOSIS — E1165 Type 2 diabetes mellitus with hyperglycemia: Secondary | ICD-10-CM | POA: Diagnosis not present

## 2018-10-01 DIAGNOSIS — Z91048 Other nonmedicinal substance allergy status: Secondary | ICD-10-CM

## 2018-10-01 DIAGNOSIS — Z91041 Radiographic dye allergy status: Secondary | ICD-10-CM

## 2018-10-01 DIAGNOSIS — G894 Chronic pain syndrome: Secondary | ICD-10-CM | POA: Diagnosis present

## 2018-10-01 DIAGNOSIS — Z79891 Long term (current) use of opiate analgesic: Secondary | ICD-10-CM

## 2018-10-01 DIAGNOSIS — J1282 Pneumonia due to coronavirus disease 2019: Secondary | ICD-10-CM | POA: Diagnosis present

## 2018-10-01 DIAGNOSIS — Z885 Allergy status to narcotic agent status: Secondary | ICD-10-CM

## 2018-10-01 DIAGNOSIS — E119 Type 2 diabetes mellitus without complications: Secondary | ICD-10-CM

## 2018-10-01 DIAGNOSIS — T380X5A Adverse effect of glucocorticoids and synthetic analogues, initial encounter: Secondary | ICD-10-CM | POA: Diagnosis not present

## 2018-10-01 DIAGNOSIS — R509 Fever, unspecified: Secondary | ICD-10-CM | POA: Diagnosis present

## 2018-10-01 HISTORY — DX: Other chronic pain: G89.29

## 2018-10-01 LAB — COMPREHENSIVE METABOLIC PANEL
ALT: 31 U/L (ref 0–44)
AST: 45 U/L — ABNORMAL HIGH (ref 15–41)
Albumin: 3.1 g/dL — ABNORMAL LOW (ref 3.5–5.0)
Alkaline Phosphatase: 51 U/L (ref 38–126)
Anion gap: 8 (ref 5–15)
BUN: 20 mg/dL (ref 6–20)
CO2: 20 mmol/L — ABNORMAL LOW (ref 22–32)
Calcium: 10.1 mg/dL (ref 8.9–10.3)
Chloride: 113 mmol/L — ABNORMAL HIGH (ref 98–111)
Creatinine, Ser: 0.96 mg/dL (ref 0.44–1.00)
GFR calc Af Amer: >60 mL/min (ref >60)
GFR calc non Af Amer: >60 mL/min (ref >60)
Glucose, Bld: 142 mg/dL — ABNORMAL HIGH (ref 70–99)
Potassium: 3.1 mmol/L — ABNORMAL LOW (ref 3.5–5.1)
Sodium: 141 mmol/L (ref 135–145)
Total Bilirubin: 0.6 mg/dL (ref 0.3–1.2)
Total Protein: 7.1 g/dL (ref 6.5–8.1)

## 2018-10-01 LAB — CBG MONITORING, ED: Glucose-Capillary: 43 mg/dL — CL (ref 70–99)

## 2018-10-01 LAB — URINALYSIS, ROUTINE W REFLEX MICROSCOPIC
Bacteria, UA: NONE SEEN
Bilirubin Urine: NEGATIVE
Glucose, UA: NEGATIVE mg/dL
Ketones, ur: 5 mg/dL — AB
Leukocytes,Ua: NEGATIVE
Nitrite: NEGATIVE
Protein, ur: >=300 mg/dL — AB
Specific Gravity, Urine: 1.035 — ABNORMAL HIGH (ref 1.005–1.030)
pH: 5 (ref 5.0–8.0)

## 2018-10-01 LAB — CBC WITH DIFFERENTIAL/PLATELET
Abs Immature Granulocytes: 0.05 10*3/uL (ref 0.00–0.07)
Basophils Absolute: 0 10*3/uL (ref 0.0–0.1)
Basophils Relative: 0 %
Eosinophils Absolute: 0 10*3/uL (ref 0.0–0.5)
Eosinophils Relative: 0 %
HCT: 32.1 % — ABNORMAL LOW (ref 36.0–46.0)
Hemoglobin: 10.5 g/dL — ABNORMAL LOW (ref 12.0–15.0)
Immature Granulocytes: 1 %
Lymphocytes Relative: 15 %
Lymphs Abs: 1.3 10*3/uL (ref 0.7–4.0)
MCH: 32.2 pg (ref 26.0–34.0)
MCHC: 32.7 g/dL (ref 30.0–36.0)
MCV: 98.5 fL (ref 80.0–100.0)
Monocytes Absolute: 0.5 10*3/uL (ref 0.1–1.0)
Monocytes Relative: 5 %
Neutro Abs: 7 10*3/uL (ref 1.7–7.7)
Neutrophils Relative %: 79 %
Platelets: 234 10*3/uL (ref 150–400)
RBC: 3.26 MIL/uL — ABNORMAL LOW (ref 3.87–5.11)
RDW: 13.6 % (ref 11.5–15.5)
WBC: 8.9 10*3/uL (ref 4.0–10.5)
nRBC: 0 % (ref 0.0–0.2)

## 2018-10-01 LAB — APTT: aPTT: 38 seconds — ABNORMAL HIGH (ref 24–36)

## 2018-10-01 LAB — D-DIMER, QUANTITATIVE: D-Dimer, Quant: 1.99 ug/mL-FEU — ABNORMAL HIGH (ref 0.00–0.50)

## 2018-10-01 LAB — FERRITIN: Ferritin: >1500 ng/mL — ABNORMAL HIGH (ref 11–307)

## 2018-10-01 LAB — PROTIME-INR
INR: 1.2 (ref 0.8–1.2)
Prothrombin Time: 15.4 seconds — ABNORMAL HIGH (ref 11.4–15.2)

## 2018-10-01 LAB — C-REACTIVE PROTEIN: CRP: 17.9 mg/dL — ABNORMAL HIGH (ref <1.0)

## 2018-10-01 LAB — FIBRINOGEN: Fibrinogen: 592 mg/dL — ABNORMAL HIGH (ref 210–475)

## 2018-10-01 LAB — LACTIC ACID, PLASMA
Lactic Acid, Venous: 1 mmol/L (ref 0.5–1.9)
Lactic Acid, Venous: 1 mmol/L (ref 0.5–1.9)

## 2018-10-01 LAB — ABO/RH: ABO/RH(D): O POS

## 2018-10-01 LAB — SARS CORONAVIRUS 2 BY RT PCR (HOSPITAL ORDER, PERFORMED IN ~~LOC~~ HOSPITAL LAB): SARS Coronavirus 2: POSITIVE — AB

## 2018-10-01 MED ORDER — GUAIFENESIN-DM 100-10 MG/5ML PO SYRP
10.0000 mL | ORAL_SOLUTION | ORAL | Status: DC | PRN
  Administered 2018-10-05 – 2018-10-08 (×2): 10 mL via ORAL
  Filled 2018-10-01: qty 10

## 2018-10-01 MED ORDER — SODIUM CHLORIDE 0.9 % IV SOLN: 1000.0000 mL | INTRAVENOUS | Status: DC

## 2018-10-01 MED ORDER — SODIUM CHLORIDE 0.9% FLUSH
3.0000 mL | Freq: Two times a day (BID) | INTRAVENOUS | Status: DC
  Administered 2018-10-01 – 2018-10-13 (×24): 3 mL via INTRAVENOUS

## 2018-10-01 MED ORDER — SODIUM CHLORIDE 0.9% FLUSH: 3.0000 mL | INTRAVENOUS | Status: DC | PRN

## 2018-10-01 MED ORDER — POLYETHYLENE GLYCOL 3350 17 G PO PACK: 17.0000 g | PACK | Freq: Every day | ORAL | Status: DC | PRN

## 2018-10-01 MED ORDER — VITAMIN C 500 MG PO TABS
500.0000 mg | ORAL_TABLET | Freq: Every day | ORAL | Status: DC
  Administered 2018-10-02 – 2018-10-13 (×12): 500 mg via ORAL
  Filled 2018-10-01 (×13): qty 1

## 2018-10-01 MED ORDER — TRAZODONE HCL 50 MG PO TABS
50.0000 mg | ORAL_TABLET | Freq: Every evening | ORAL | Status: DC | PRN
  Administered 2018-10-01 – 2018-10-11 (×7): 50 mg via ORAL
  Filled 2018-10-01 (×7): qty 1

## 2018-10-01 MED ORDER — DEXTROSE 50 % IV SOLN
1.0000 | Freq: Once | INTRAVENOUS | Status: AC
  Administered 2018-10-01: 50 mL via INTRAVENOUS

## 2018-10-01 MED ORDER — VANCOMYCIN HCL IN DEXTROSE 1-5 GM/200ML-% IV SOLN
1000.0000 mg | Freq: Once | INTRAVENOUS | Status: AC
  Administered 2018-10-01: 1000 mg via INTRAVENOUS
  Filled 2018-10-01: qty 200

## 2018-10-01 MED ORDER — SODIUM CHLORIDE 0.9 % IV SOLN
1000.0000 mL | INTRAVENOUS | Status: DC
  Administered 2018-10-01: 1000 mL via INTRAVENOUS

## 2018-10-01 MED ORDER — HYDRALAZINE HCL 20 MG/ML IJ SOLN: 10.0000 mg | Freq: Four times a day (QID) | INTRAMUSCULAR | Status: DC | PRN

## 2018-10-01 MED ORDER — TOPIRAMATE 100 MG PO TABS
100.0000 mg | ORAL_TABLET | Freq: Two times a day (BID) | ORAL | Status: DC
  Administered 2018-10-01 – 2018-10-05 (×8): 100 mg via ORAL
  Filled 2018-10-01 (×10): qty 1

## 2018-10-01 MED ORDER — INSULIN ASPART 100 UNIT/ML ~~LOC~~ SOLN
0.0000 [IU] | Freq: Three times a day (TID) | SUBCUTANEOUS | Status: DC
  Administered 2018-10-02 (×3): 4 [IU] via SUBCUTANEOUS
  Administered 2018-10-03: 18:00:00 3 [IU] via SUBCUTANEOUS
  Administered 2018-10-03: 11 [IU] via SUBCUTANEOUS
  Administered 2018-10-03: 15 [IU] via SUBCUTANEOUS
  Administered 2018-10-04 (×3): 11 [IU] via SUBCUTANEOUS
  Administered 2018-10-05: 3 [IU] via SUBCUTANEOUS
  Administered 2018-10-05 (×2): 7 [IU] via SUBCUTANEOUS
  Administered 2018-10-06: 11 [IU] via SUBCUTANEOUS
  Administered 2018-10-06: 3 [IU] via SUBCUTANEOUS
  Administered 2018-10-07: 7 [IU] via SUBCUTANEOUS
  Administered 2018-10-07: 4 [IU] via SUBCUTANEOUS
  Administered 2018-10-08: 3 [IU] via SUBCUTANEOUS
  Administered 2018-10-08: 7 [IU] via SUBCUTANEOUS

## 2018-10-01 MED ORDER — VANCOMYCIN HCL 10 G IV SOLR
1250.0000 mg | Freq: Two times a day (BID) | INTRAVENOUS | Status: DC
  Administered 2018-10-01: 1250 mg via INTRAVENOUS
  Filled 2018-10-01 (×4): qty 1250

## 2018-10-01 MED ORDER — ZINC SULFATE 220 (50 ZN) MG PO CAPS
220.0000 mg | ORAL_CAPSULE | Freq: Every day | ORAL | Status: DC
  Administered 2018-10-02 – 2018-10-13 (×12): 220 mg via ORAL
  Filled 2018-10-01 (×13): qty 1

## 2018-10-01 MED ORDER — METRONIDAZOLE IN NACL 5-0.79 MG/ML-% IV SOLN
500.0000 mg | Freq: Once | INTRAVENOUS | Status: AC
  Administered 2018-10-01: 500 mg via INTRAVENOUS
  Filled 2018-10-01: qty 100

## 2018-10-01 MED ORDER — PREGABALIN 50 MG PO CAPS
150.0000 mg | ORAL_CAPSULE | Freq: Four times a day (QID) | ORAL | Status: DC
  Administered 2018-10-01 – 2018-10-13 (×46): 150 mg via ORAL
  Filled 2018-10-01 (×16): qty 3
  Filled 2018-10-01: qty 6
  Filled 2018-10-01 (×30): qty 3

## 2018-10-01 MED ORDER — SODIUM CHLORIDE 0.9 % IV SOLN: 250.0000 mL | INTRAVENOUS | Status: DC | PRN

## 2018-10-01 MED ORDER — PANTOPRAZOLE SODIUM 40 MG PO TBEC
40.0000 mg | DELAYED_RELEASE_TABLET | Freq: Every day | ORAL | Status: DC
  Administered 2018-10-02: 40 mg via ORAL
  Filled 2018-10-01: qty 1

## 2018-10-01 MED ORDER — VANCOMYCIN HCL 10 G IV SOLR: 1500.0000 mg | Freq: Two times a day (BID) | INTRAVENOUS | Status: DC

## 2018-10-01 MED ORDER — BENZONATATE 100 MG PO CAPS
100.0000 mg | ORAL_CAPSULE | Freq: Three times a day (TID) | ORAL | Status: DC | PRN
  Administered 2018-10-02 – 2018-10-11 (×5): 100 mg via ORAL
  Filled 2018-10-01 (×5): qty 1

## 2018-10-01 MED ORDER — POTASSIUM CHLORIDE 10 MEQ/100ML IV SOLN
10.0000 meq | INTRAVENOUS | Status: AC
  Administered 2018-10-01 (×2): 10 meq via INTRAVENOUS
  Filled 2018-10-01 (×2): qty 100

## 2018-10-01 MED ORDER — SODIUM CHLORIDE 0.9 % IV SOLN
2.0000 g | Freq: Once | INTRAVENOUS | Status: AC
  Administered 2018-10-01: 2 g via INTRAVENOUS
  Filled 2018-10-01: qty 2

## 2018-10-01 MED ORDER — METHYLPREDNISOLONE SODIUM SUCC 125 MG IJ SOLR
0.5000 mg/kg | Freq: Two times a day (BID) | INTRAMUSCULAR | Status: AC
  Administered 2018-10-01 – 2018-10-02 (×3): 51.25 mg via INTRAVENOUS
  Filled 2018-10-01 (×3): qty 2

## 2018-10-01 MED ORDER — INSULIN ASPART 100 UNIT/ML ~~LOC~~ SOLN: 0.0000 [IU] | Freq: Every day | SUBCUTANEOUS | Status: DC

## 2018-10-01 MED ORDER — ACETAMINOPHEN 650 MG RE SUPP: 650.0000 mg | Freq: Four times a day (QID) | RECTAL | Status: DC | PRN

## 2018-10-01 MED ORDER — INSULIN ASPART 100 UNIT/ML ~~LOC~~ SOLN: 3.0000 [IU] | Freq: Three times a day (TID) | SUBCUTANEOUS | Status: DC

## 2018-10-01 MED ORDER — VITAMIN B-1 100 MG PO TABS
100.0000 mg | ORAL_TABLET | Freq: Every day | ORAL | Status: DC
  Administered 2018-10-02 – 2018-10-13 (×12): 100 mg via ORAL
  Filled 2018-10-01 (×13): qty 1

## 2018-10-01 MED ORDER — METOPROLOL SUCCINATE ER 25 MG PO TB24
50.0000 mg | ORAL_TABLET | Freq: Two times a day (BID) | ORAL | Status: DC
  Administered 2018-10-01 – 2018-10-07 (×11): 50 mg via ORAL
  Filled 2018-10-01 (×13): qty 2

## 2018-10-01 MED ORDER — METHYLPREDNISOLONE SODIUM SUCC 125 MG IJ SOLR
125.0000 mg | Freq: Once | INTRAMUSCULAR | Status: AC
  Administered 2018-10-01: 125 mg via INTRAVENOUS
  Filled 2018-10-01: qty 2

## 2018-10-01 MED ORDER — BUPRENORPHINE 20 MCG/HR TD PTWK: 1.0000 | MEDICATED_PATCH | TRANSDERMAL | Status: DC

## 2018-10-01 MED ORDER — OXYBUTYNIN CHLORIDE ER 10 MG PO TB24
10.0000 mg | ORAL_TABLET | Freq: Every day | ORAL | Status: DC
  Administered 2018-10-02 – 2018-10-13 (×12): 10 mg via ORAL
  Filled 2018-10-01 (×14): qty 1

## 2018-10-01 MED ORDER — MULTIVITAMIN PO LIQD: Freq: Every day | ORAL | Status: DC

## 2018-10-01 MED ORDER — SODIUM CHLORIDE 0.9 % IV BOLUS
1000.0000 mL | Freq: Once | INTRAVENOUS | Status: AC
  Administered 2018-10-01: 1000 mL via INTRAVENOUS

## 2018-10-01 MED ORDER — SODIUM CHLORIDE 0.9 % IV SOLN
100.0000 mg | INTRAVENOUS | Status: AC
  Administered 2018-10-02 – 2018-10-05 (×4): 100 mg via INTRAVENOUS
  Filled 2018-10-01 (×4): qty 20

## 2018-10-01 MED ORDER — FOLIC ACID 1 MG PO TABS
1.0000 mg | ORAL_TABLET | Freq: Every day | ORAL | Status: DC
  Administered 2018-10-02 – 2018-10-13 (×12): 1 mg via ORAL
  Filled 2018-10-01 (×13): qty 1

## 2018-10-01 MED ORDER — DEXTROSE 50 % IV SOLN
INTRAVENOUS | Status: AC
  Administered 2018-10-01: 20:00:00
  Filled 2018-10-01: qty 50

## 2018-10-01 MED ORDER — SODIUM CHLORIDE 0.9 % IV SOLN: 2.0000 g | Freq: Three times a day (TID) | INTRAVENOUS | Status: DC

## 2018-10-01 MED ORDER — ACETAMINOPHEN 325 MG PO TABS
650.0000 mg | ORAL_TABLET | Freq: Four times a day (QID) | ORAL | Status: DC | PRN
  Administered 2018-10-01 – 2018-10-10 (×5): 650 mg via ORAL
  Filled 2018-10-01 (×5): qty 2

## 2018-10-01 MED ORDER — HYDROCOD POLST-CPM POLST ER 10-8 MG/5ML PO SUER: 5.0000 mL | Freq: Two times a day (BID) | ORAL | Status: DC | PRN

## 2018-10-01 MED ORDER — SODIUM CHLORIDE 0.9 % IV SOLN
200.0000 mg | Freq: Once | INTRAVENOUS | Status: AC
  Administered 2018-10-01: 200 mg via INTRAVENOUS
  Filled 2018-10-01: qty 40

## 2018-10-01 MED ORDER — VENLAFAXINE HCL ER 37.5 MG PO CP24
37.5000 mg | ORAL_CAPSULE | Freq: Every day | ORAL | Status: DC
  Administered 2018-10-01 – 2018-10-12 (×11): 37.5 mg via ORAL
  Filled 2018-10-01 (×13): qty 1

## 2018-10-01 MED ORDER — IPRATROPIUM BROMIDE HFA 17 MCG/ACT IN AERS
2.0000 | INHALATION_SPRAY | Freq: Four times a day (QID) | RESPIRATORY_TRACT | Status: DC
  Administered 2018-10-01 – 2018-10-12 (×42): 2 via RESPIRATORY_TRACT
  Filled 2018-10-01: qty 12.9

## 2018-10-01 MED ORDER — ONDANSETRON HCL 4 MG/2ML IJ SOLN
4.0000 mg | Freq: Four times a day (QID) | INTRAMUSCULAR | Status: DC | PRN
  Administered 2018-10-03: 4 mg via INTRAVENOUS
  Filled 2018-10-01: qty 2

## 2018-10-01 MED ORDER — ADULT MULTIVITAMIN W/MINERALS CH
1.0000 | ORAL_TABLET | Freq: Every day | ORAL | Status: DC
  Administered 2018-10-02 – 2018-10-13 (×12): 1 via ORAL
  Filled 2018-10-01 (×13): qty 1

## 2018-10-01 MED ORDER — GLIPIZIDE 10 MG PO TABS
10.0000 mg | ORAL_TABLET | Freq: Every day | ORAL | Status: DC
  Filled 2018-10-01: qty 1

## 2018-10-01 MED ORDER — ENOXAPARIN SODIUM 60 MG/0.6ML ~~LOC~~ SOLN
50.0000 mg | SUBCUTANEOUS | Status: DC
  Administered 2018-10-01: 50 mg via SUBCUTANEOUS
  Filled 2018-10-01: qty 0.6

## 2018-10-01 MED ORDER — ONDANSETRON HCL 4 MG PO TABS
4.0000 mg | ORAL_TABLET | Freq: Four times a day (QID) | ORAL | Status: DC | PRN
  Filled 2018-10-01: qty 1

## 2018-10-01 NOTE — Progress Notes (Addendum)
Pharmacy Antibiotic Note  Gina Hill is a 58 y.o. female admitted on 10/01/2018 with altered mental status. CXR suggests possible multi-lobar, bilateral pneumonia. Pharmacy has been consulted for vancomycin and cefepime  dosing.   Plan: Start cefepime 2g IV q8h Give vancomycin 1g x1 dose (not a full load) Start vancomycin 1.25g IV q12h today at 1600, since pt didn't get full loading dose of vancomycin Calculated AUC:  478.3 mcg*h/mL Calculated Css max: 29.6 mcg/mL Calculated Css min: 14.0 mcg/mL Pharmacy to order peaks/troughs if appropriate depending on disease state and length of therapy.   Height: 5\' 7"  (170.2 cm) Weight: 225 lb (102.1 kg) IBW/kg (Calculated) : 61.6  Temp (24hrs), Avg:101 F (38.3 C), Min:99.1 F (37.3 C), Max:102.8 F (39.3 C)  Recent Labs  Lab 10/01/18 1034 10/01/18 1158  WBC 8.9  --   CREATININE 0.96  --   LATICACIDVEN 1.0 1.0    Estimated Creatinine Clearance: 80.4 mL/min (by C-G formula based on SCr of 0.96 mg/dL).    Allergies  Allergen Reactions  . Codeine Nausea And Vomiting    Antimicrobials this admission: cefepime 9/12>>  vancomycin 9/12 >>   metronidazole 9/12 x1 dose in ED   Microbiology results: 9/12 [facility] x2: pending 9/12 UCx: sent  9/12 SARS CO-V 2: positive   9/12 MRSA PCR: pending  Thank you for allowing pharmacy to participate in this patient's care.  Despina Pole, Pharm. D. Clinical Pharmacist 10/01/2018 1:33 PM

## 2018-10-01 NOTE — H&P (Signed)
Patient Demographics:    Gina Hill, is a 58 y.o. female  MRN: [id number]   DOB - [birth year]  Admit Date - 10/01/2018  Outpatient Primary MD for the patient is Dion Body, MD   Assessment & Plan:    Principal Problem:   Acute Hypoxic Respiratory failure, acute--- due to COVID-19 Active Problems:   Acute respiratory disease due to COVID-19 virus   Pneumonia due to COVID-19 virus   Hypertension   Diabetes ([place])    1)Acute hypoxic respiratory failure secondary to COVID-19 infection/pneumonia--- The treatment plan and use of medications  for treatment of COVID-19 infection and possible side effects were discussed with patient/family,  explained that there is No proven definitive treatment for COVID-19 infection, any medications used here are based on published clinical articles/anecdotal data which at times and not yet peer-reviewed or randomized control trials. Complete risks and long-term side effects are unknown, however in the best clinical judgment they seem to be of some clinical benefit . --potential side effects of Remdesivir including, but not limited to allergic reaction, nausea, vomiting, elevated LFTs discussed with patient ,also discussed potential steroid side effect including elevated blood sugars, elevated blood pressure, psychosis/anxiety,  insomnia --Patient verbalizes understanding and agrees to treatment protocols   --Patient is positive for COVID-19 infection, chest x-ray with findings of infiltrates/opacities,  patient is hypoxic and requiring continuous supplemental oxygen---patient meets criteria for initiation of Remdesivir AND Steroid therapy per protocol  --Check and trend fibrinogen, CRP, pro calcitonin, CBC, BMP, d-dimer, LDH, ferritin and LFTs --Supplemental oxygen to keep O2 sats  above 93% -Follow serial chest x-rays and ABGs as indicated --- Encourage prone positioning for More than 16 hours/day in increments of 2 to 3 hours at a time if able to tolerate --Attempt to maintain euvolemic state --Zinc and vitamin C as ordered -Albuterol inhaler as needed   2)Diabetes--- no recent A1c available, patient had episode of hypoglycemia today, overall anticipate increasing hyperglycemia due to ongoing steroid use -Continue glipizide -Use Novolog/Humalog Sliding scale insulin with Accu-Cheks/Fingersticks as ordered   3)HTN--- stable, continue Toprol-XL 50 mg twice daily,   may use IV Hydralazine 10 mg  Every 4 hours Prn for systolic blood pressure over 160 mmhg   4) depression/chronic pain syndrome--continue Effexor, Topamax and Lyrica as ordered.  Resume home Butrans transdermal patch  --- Prophylaxis--- Lovenox for DVT prophylaxis, Protonix for GI prophylaxis while on high-dose steroids  With History of - Reviewed by me  Past Medical History:  Diagnosis Date   Arthritis    Asthma    Cancer ([place])    right breast   Chronic back pain    Chronic pelvic pain in female    Complication of anesthesia    SOB after anesthesia so " I usually have to get a breathing treatment after anesthesia."   Diabetes mellitus without complication ([place])    Endometriosis    GERD (gastroesophageal reflux disease)    Hypercholesterolemia  Hyperparathyroidism ([place])    Hypertension    Neuropathic pain    Obese       Past Surgical History:  Procedure Laterality Date   BREAST BIOPSY     Right   CESAREAN SECTION     FOOT SURGERY     Left foot   LAPAROSCOPIC LYSIS OF ADHESIONS     LAPAROTOMY     PORTACATH PLACEMENT Left 06/22/2013   Procedure: INSERTION PORT-A-CATH;  Surgeon: Luella Cook III, MD;  Location: [place];  Service: General;  Laterality: Left;   SALPINGECTOMY     Partial Right     WISDOM TOOTH EXTRACTION        Chief Complaint  Patient  presents with   Fever      HPI:    Gina Hill  is a 58 y.o. female with past medical history relevant for diabetes mellitus, hypertension, depression and chronic pain syndrome as well as obesity presents by EMS with concerns about fevers up to 104 and altered mentation of less than 24 hours duration  --Patient apparently developed cough and respiratory symptoms over the last couple of days, PCP had apparently prescribed Levaquin for her  -- In ED temp is down to 102.8 from 104 after Tylenol given by EMS en route , patient was tachycardic with heart rate in the 120s and hypoxic with O2 sats of 82% initially on room air O2 sats up to 98 200% on 3 to 4 L of oxygen via nasal cannula -COVID-19 test positive -Chest x-ray suggest bilateral multi- focal pneumonia  in in ED patient initially received metronidazole, Vanco and cefepime from EDP due to concerns about bacterial pneumonia initially  -= D-dimer is elevated at 1.99 -Fibrinogen is 592, lactic acid is 1.0 -UA is not suggestive of UTI, specific gravity is 1035 with ketones -Creatinine is 0.96 with a potassium of 3.1 and bicarb of 20 with a glucose of 142 -AST 45 ALT 31 with alk phos of 51 and T bili of 0.6 -CBC with a white count of 8.9 hemoglobin is 10.5 and hematocrit is 32.1 with platelets of 234 -INR is 1.2  --Attempted to reach patient's husband unsuccessfully   Review of systems:    In addition to the HPI above,   A full Review of  Systems was done, all other systems reviewed are negative except as noted above in HPI , .    Social History:  Reviewed by me    Social History   Tobacco Use   Smoking status: Never Smoker   Smokeless tobacco: Never Used  Substance Use Topics   Alcohol use: No       Family History :  Reviewed by me    Family History  Problem Relation Age of Onset   Hypertension Mother    Mental illness Mother    Heart disease Mother    Diabetes Father    Hypertension Father     Cancer Father        prostate   Kidney disease Father    Breast cancer Maternal Aunt    Cancer Maternal Aunt        kidney    Home Medications:   Prior to Admission medications   Medication Sig Start Date End Date Taking? Authorizing Provider  benzonatate (TESSALON) 100 MG capsule Take 100 mg by mouth 3 (three) times daily as needed for cough.   Yes [provider]  buprenorphine (BUTRANS) 20 MCG/HR PTWK patch Place 1 patch onto the skin once  a week.   Yes [provider]  diclofenac sodium (VOLTAREN) 1 % GEL Apply 2 g topically 2 (two) times daily as needed (to painful joint).   Yes [provider]  fluticasone (FLONASE) 50 MCG/ACT nasal spray 2 sprays See admin instructions.    Yes [provider]  glipiZIDE (GLUCOTROL) 10 MG tablet Take 10 mg by mouth daily.   Yes [provider]  levofloxacin (LEVAQUIN) 750 MG tablet Take 750 mg by mouth daily. Starting on 09/27/2018 for 5 days   Yes [provider]  metoprolol succinate (TOPROL-XL) 50 MG 24 hr tablet Take 50 mg by mouth 2 (two) times daily. Take with or immediately following a meal.   Yes [provider]  Multiple Vitamins-Minerals (MULTIVITAMIN PO) Take 1 tablet by mouth daily.   Yes [provider]  omeprazole (PRILOSEC) 20 MG capsule Take 40 mg by mouth daily.   Yes [provider]  oxybutynin (DITROPAN-XL) 10 MG 24 hr tablet Take 10 mg by mouth daily.   Yes [provider]  pregabalin (LYRICA) 150 MG capsule Take 150 mg by mouth 4 (four) times daily.   Yes [provider]  topiramate (TOPAMAX) 100 MG tablet Take 100 mg by mouth 2 (two) times daily.    Yes [provider]  venlafaxine XR (EFFEXOR-XR) 37.5 MG 24 hr capsule Take 37.5 mg by mouth at bedtime.   Yes [provider]     Allergies:     Allergies  Allergen Reactions   Gadolinium Derivatives Other (See Comments)    Only PO contrast causes confusion and  diarrhea   Iodine Diarrhea   Medroxyprogesterone Nausea And Vomiting and Other (See Comments)    Does not want to take hormones    Tape Other (See Comments)    Tears skin - paper tape OK   Codeine Nausea And Vomiting     Physical Exam:   Vitals  Blood pressure 110/77, pulse 95, temperature 99.4 F (37.4 C), temperature source Oral, resp. rate 17, height _0  (1.702 m), weight 102.1 kg, SpO2 96 %.  Physical Examination: General appearance - alert, obese appearing-with conversational dyspnea  mental status - alert, oriented to person, place, and time,  Eyes - sclera anicteric Nose- [place] 3L/min Neck - supple, no JVD elevation , Chest -diminished breath sounds bilaterally scattered rhonchi Heart - S1 and S2 normal, regular  Abdomen - soft, nontender, nondistended, no masses or organomegaly Neurological - screening mental status exam normal, neck supple without rigidity, cranial nerves II through XII intact, DTR's normal and symmetric Extremities - no pedal edema noted, intact peripheral pulses  Skin - warm, dry     Data Review:    CBC Recent Labs  Lab 10/01/18 1034  WBC 8.9  HGB 10.5*  HCT 32.1*  PLT 234  MCV 98.5  MCH 32.2  MCHC 32.7  RDW 13.6  LYMPHSABS 1.3  MONOABS 0.5  EOSABS 0.0  BASOSABS 0.0   ------------------------------------------------------------------------------------------------------------------  Chemistries  Recent Labs  Lab 10/01/18 1034  NA 141  K 3.1*  CL 113*  CO2 20*  GLUCOSE 142*  BUN 20  CREATININE 0.96  CALCIUM 10.1  AST 45*  ALT 31  ALKPHOS 51  BILITOT 0.6   ------------------------------------------------------------------------------------------------------------------ estimated creatinine clearance is 80.4 mL/min (by C-G formula based on SCr of 0.96 mg/dL). ------------------------------------------------------------------------------------------------------------------ No results for input(s): TSH, T4TOTAL, T3FREE,  THYROIDAB in the last 72 hours.  Invalid input(s): FREET3   Coagulation profile Recent Labs  Lab  10/01/18 1034  INR 1.2   ------------------------------------------------------------------------------------------------------------------- No results for input(s): DDIMER in the last 72 hours. -------------------------------------------------------------------------------------------------------------------  Cardiac Enzymes No results for input(s): CKMB, TROPONINI, MYOGLOBIN in the last 168 hours.  Invalid input(s): CK ------------------------------------------------------------------------------------------------------------------ No results found for: BNP   ---------------------------------------------------------------------------------------------------------------  Urinalysis    Component Value Date/Time   COLORURINE YELLOW 10/01/2018 0959   APPEARANCEUR HAZY (A) 10/01/2018 0959   LABSPEC 1.035 (H) 10/01/2018 0959   PHURINE 5.0 10/01/2018 0959   GLUCOSEU NEGATIVE 10/01/2018 0959   HGBUR SMALL (A) 10/01/2018 0959   BILIRUBINUR NEGATIVE 10/01/2018 0959   KETONESUR 5 (A) 10/01/2018 0959   PROTEINUR >=300 (A) 10/01/2018 0959   UROBILINOGEN 1.0 05/28/2009 2049   NITRITE NEGATIVE 10/01/2018 0959   LEUKOCYTESUR NEGATIVE 10/01/2018 0959    ----------------------------------------------------------------------------------------------------------------   Imaging Results:    Dg Chest Port 1 View  Result Date: 10/01/2018 CLINICAL DATA:  58 year old female with history of cough. EXAM: PORTABLE CHEST 1 VIEW COMPARISON:  Chest x-ray 06/22/2013. FINDINGS: Patchy ill-defined opacities are noted throughout the lungs bilaterally, asymmetrically distributed, most severe throughout the mid to lower lungs. No definite pleural effusions. Heart size is normal. The patient is rotated to the left on today's exam, resulting in distortion of the mediastinal contours and reduced diagnostic  sensitivity and specificity for mediastinal pathology. Surgical clips project over the right infrahilar region. IMPRESSION: 1. The appearance the chest is highly concerning for multilobar bilateral pneumonia. Electronically Signed   By: Vinnie Langton M.D.   On: 10/01/2018 10:57    Radiological Exams on Admission: Dg Chest Port 1 View  Result Date: 10/01/2018 CLINICAL DATA:  58 year old female with history of cough. EXAM: PORTABLE CHEST 1 VIEW COMPARISON:  Chest x-ray 06/22/2013. FINDINGS: Patchy ill-defined opacities are noted throughout the lungs bilaterally, asymmetrically distributed, most severe throughout the mid to lower lungs. No definite pleural effusions. Heart size is normal. The patient is rotated to the left on today's exam, resulting in distortion of the mediastinal contours and reduced diagnostic sensitivity and specificity for mediastinal pathology. Surgical clips project over the right infrahilar region. IMPRESSION: 1. The appearance the chest is highly concerning for multilobar bilateral pneumonia. Electronically Signed   By: Vinnie Langton M.D.   On: 10/01/2018 10:57   DVT Prophylaxis -SCD  /lovenox AM Labs Ordered, also please review Full Orders  Family Communication: Admission, patients condition and plan of care including tests being ordered have been discussed with the patient  who indicate understanding and agree with the plan   Code Status - Full Code  Likely--- transfer to [facility]  Condition   fair  Roxan Hockey M.D on 10/01/2018 at 5:12 PM Go to [email or web address] -  for contact info  [facility] - Office  [phone]

## 2018-10-01 NOTE — ED Notes (Signed)
Report given to [facility], Aida Raider RN.

## 2018-10-01 NOTE — ED Provider Notes (Signed)
[facility] EMERGENCY DEPARTMENT Provider Note   CSN: [id number] Arrival date & time: 10/01/18  [id number]     History   Chief Complaint Chief Complaint  Patient presents with   Fever    HPI Gina Hill is a 58 y.o. female.     The history is provided by the patient and the EMS personnel. The history is limited by the condition of the patient (acuity of condition).  Fever  Pt was seen at 0955. Per EMS, pt's sitter, and pt report:  Pt c/o fever, SOB, and cough for the past 2 weeks. Home temps to "100.4."  Endorses "CP with coughing" for the past 1 week. Has been associated with generalized weakness, body aches, decreased appetite. Pt has been evaluated by her PMD and rx levaquin without improvement. Pt's caregiver states pt was "altered" today and cough is now productive of "green" sputum. EMS states temp was "104" on scene, and they gave APAP 1000mg  PO as well as IV NS 368ml. Denies rash, no N/V/D, no abd pain, no focal motor weakness.    Past Medical History:  Diagnosis Date   Arthritis    Asthma    Cancer ([place])    right breast   Chronic back pain    Chronic pelvic pain in female    Complication of anesthesia    SOB after anesthesia so " I usually have to get a breathing treatment after anesthesia."   Diabetes mellitus without complication (HCC)    Endometriosis    GERD (gastroesophageal reflux disease)    Hypercholesterolemia    Hyperparathyroidism ([place])    Hypertension    Neuropathic pain    Obese     Patient Active Problem List   Diagnosis Date Noted   Breast cancer of upper-outer quadrant of right female breast ([place]) 11/05/2015   Breast mass, right 05/08/2013   Hypertension 02/26/2012   Diabetes ([place]) 02/26/2012   Chronic pelvic pain in female 02/26/2012   Obese    Endometriosis     Past Surgical History:  Procedure Laterality Date   BREAST BIOPSY     Right   CESAREAN SECTION     FOOT SURGERY     Left foot   LAPAROSCOPIC LYSIS  OF ADHESIONS     LAPAROTOMY     PORTACATH PLACEMENT Left 06/22/2013   Procedure: INSERTION PORT-A-CATH;  Surgeon: Merrie Roof, MD;  Location: [place];  Service: General;  Laterality: Left;   SALPINGECTOMY     Partial Right     WISDOM TOOTH EXTRACTION       OB History    Gravida  4   Para  3   Term  3   Preterm      AB  1   Living  3     SAB      TAB      Ectopic  1   Multiple      Live Births               Home Medications    Prior to Admission medications   Medication Sig Start Date End Date Taking? Authorizing Provider  amitriptyline (ELAVIL) 75 MG tablet Take 1 tablet (75 mg total) by mouth at bedtime. 02/26/12   Delsa Bern, MD  Amlodipine-Valsartan-HCTZ [phone] MG TABS Take 1 tablet by mouth daily.  02/04/13   [provider]  CALCIUM PO Take 2 tablets by mouth daily.    [provider]  CRESTOR 20 MG tablet Take 20  mg by mouth daily.  04/18/13   [provider]  cyclobenzaprine (FLEXERIL) 10 MG tablet Take 10 mg by mouth 3 (three) times daily.  03/21/13   [provider]  diazepam (VALIUM) 5 MG tablet Take 5 mg by mouth daily as needed for anxiety.  02/13/13   [provider]  escitalopram (LEXAPRO) 10 MG tablet Take 20 mg by mouth daily.     [provider]  glipiZIDE (GLUCOTROL XL) 5 MG 24 hr tablet Take 5 mg by mouth daily.    [provider]  hydrOXYzine (ATARAX/VISTARIL) 25 MG tablet Take 25 mg by mouth 3 (three) times daily as needed for anxiety or itching.     [provider]  ibuprofen (ADVIL,MOTRIN) 600 MG tablet Take 1,200 mg by mouth every 8 (eight) hours as needed for headache or moderate pain.    [provider]  l-methylfolate-B6-B12 (METANX) 3-35-2 MG TABS Take 1 tablet by mouth daily.    [provider]  Liraglutide (VICTOZA [place]) Inject 1.8 mg into the skin every evening.     [provider]  Multiple Vitamins-Minerals (MULTIVITAMIN PO) Take  1 tablet by mouth daily.    [provider]  omega-3 acid ethyl esters (LOVAZA) 1 G capsule Take 2 g by mouth 2 (two) times daily.    [provider]  oxyCODONE-acetaminophen (PERCOCET) 10-325 MG per tablet 1 tablet every 6 (six) hours as needed for pain.  05/01/13   [provider]  oxyCODONE-acetaminophen (ROXICET) 5-325 MG per tablet Take 1-2 tablets by mouth every 4 (four) hours as needed. 06/22/13   Autumn Messing III, MD  pregabalin (LYRICA) 150 MG capsule Take 150 mg by mouth 2 (two) times daily.    [provider]  tiZANidine (ZANAFLEX) 4 MG capsule Take 4 mg by mouth 3 (three) times daily.    [provider]  topiramate (TOPAMAX) 25 MG tablet Take 50 mg by mouth 2 (two) times daily.     [provider]    Family History Family History  Problem Relation Age of Onset   Hypertension Mother    Mental illness Mother    Heart disease Mother    Diabetes Father    Hypertension Father    Cancer Father        prostate   Kidney disease Father    Breast cancer Maternal Aunt    Cancer Maternal Aunt        kidney    Social History Social History   Tobacco Use   Smoking status: Never Smoker   Smokeless tobacco: Never Used  Substance Use Topics   Alcohol use: No   Drug use: Never    Types: Oxycodone     Allergies   Codeine   Review of Systems Review of Systems  Unable to perform ROS: Acuity of condition  Constitutional: Positive for fever.     Physical Exam Updated Vital Signs BP 92/66 (BP Location: Left Arm)    Pulse 99    Temp 99.1 F (37.3 C) (Oral)    Resp 14    Ht 5\' 7"  (1.702 m)    Wt 102.1 kg    SpO2 98%    BMI 35.24 kg/m    Patient Vitals for the past 24 hrs:  BP Temp Temp src Pulse Resp SpO2 Height Weight  10/01/18 1330 122/74 -- -- 97 19 98 % -- --  10/01/18 1303 92/66 -- -- 99 16 98 % -- --  10/01/18 1230 96/63 -- --  100 14 95 % -- --  10/01/18 1216 -- -- -- -- -- -- 5\' 7"  (1.702 m) 102.1 kg   10/01/18 1212 104/67 99.1 F (37.3 C) Oral (!) 107 16 93 % -- --  10/01/18 1130 104/65 -- -- (!) 108 (!) 25 (!) 82 % -- --  10/01/18 1100 115/67 -- -- (!) 121 14 (!) 83 % -- --  10/01/18 0955 -- -- -- -- -- -- 5\' 7"  (1.702 m) 107.5 kg  10/01/18 0953 115/73 (!) 102.8 F (39.3 C) Rectal (!) 123 (!) 25 (!) 89 % -- --     Physical Exam 1000: Physical examination:  Nursing notes reviewed; Vital signs and O2 SAT reviewed;  Constitutional: Well developed, Well nourished, In no acute distress; Head:  Normocephalic, atraumatic; Eyes: EOMI, PERRL, No scleral icterus; ENMT: Mouth and pharynx normal, Mucous membranes dry; Neck: Supple, Full range of motion, No lymphadenopathy; Cardiovascular: Tachycardic rate and rhythm, No gallop; Respiratory: Breath sounds coarse & equal bilaterally, No wheezes. Tachypneic. Speaking full sentences with ease, Normal respiratory effort/excursion; Chest: Nontender, Movement normal; Abdomen: Soft, Nontender, Nondistended, Normal bowel sounds; Genitourinary: No CVA tenderness; Extremities: Peripheral pulses normal, No tenderness, No edema, No calf edema or asymmetry.; Neuro: AA&Ox3, No facial droop.  Speech clear. No gross focal motor deficits in extremities.; Skin: Color normal, Warm, Dry.    ED Treatments / Results  Labs (all labs ordered are listed, but only abnormal results are displayed)   EKG EKG Interpretation  Date/Time:  Saturday October 01 2018 09:58:53 EDT Ventricular Rate:  115 PR Interval:    QRS Duration: 98 QT Interval:  316 QTC Calculation: 437 R Axis:   -5 Text Interpretation:  Sinus tachycardia Borderline prolonged PR interval Borderline T abnormalities, inferior leads Artifact When compared with ECG of 06/22/2013 Rate faster Confirmed by Francine Graven [phone]) on 10/01/2018 10:10:51 AM   Radiology   Procedures Procedures (including critical care time)  Medications Ordered in ED Medications  0.9 %  sodium chloride infusion (1,000 mLs  Intravenous New Bag/Given 10/01/18 1213)  potassium chloride 10 mEq in 100 mL IVPB (10 mEq Intravenous New Bag/Given 10/01/18 1244)  vancomycin (VANCOCIN) 1,500 mg in sodium chloride 0.9 % 500 mL IVPB (has no administration in time range)  ceFEPIme (MAXIPIME) 2 g in sodium chloride 0.9 % 100 mL IVPB (0 g Intravenous Stopped 10/01/18 1108)  metroNIDAZOLE (FLAGYL) IVPB 500 mg (0 mg Intravenous Stopped 10/01/18 1141)  vancomycin (VANCOCIN) IVPB 1000 mg/200 mL premix (0 mg Intravenous Stopped 10/01/18 1234)  sodium chloride 0.9 % bolus 1,000 mL (0 mLs Intravenous Stopped 10/01/18 1212)  sodium chloride 0.9 % bolus 1,000 mL (1,000 mLs Intravenous New Bag/Given 10/01/18 1247)     Initial Impression / Assessment and Plan / ED Course  I have reviewed the triage vital signs and the nursing notes.  Pertinent labs & imaging results that were available during my care of the patient were reviewed by me and considered in my medical decision making (see chart for details).     MDM Reviewed: previous chart, nursing note and vitals Reviewed previous: labs and ECG Interpretation: labs, ECG and x-ray Total time providing critical care: 30-74 minutes. This excludes time spent performing separately reportable procedures and services. Consults: admitting MD   CRITICAL CARE Performed by: Francine Graven Total critical care time: 45 minutes Critical care time was exclusive of separately billable procedures and treating other patients. Critical care was necessary to treat or prevent imminent or life-threatening deterioration. Critical care was time  spent personally by me on the following activities: development of treatment plan with patient and/or surrogate as well as nursing, discussions with consultants, evaluation of patient's response to treatment, examination of patient, obtaining history from patient or surrogate, ordering and performing treatments and interventions, ordering and review of laboratory studies,  ordering and review of radiographic studies, pulse oximetry and re-evaluation of patient's condition.   Results for orders placed or performed during the hospital encounter of 10/01/18  SARS Coronavirus 2 [facility] order, Performed in [facility] hospital lab) Nasopharyngeal Nasopharyngeal Swab   Specimen: Nasopharyngeal Swab  Result Value Ref Range   SARS Coronavirus 2 POSITIVE (A) NEGATIVE  Blood Culture (routine x 2)   Specimen: BLOOD RIGHT HAND  Result Value Ref Range   Specimen Description      BLOOD RIGHT HAND BOTTLES DRAWN AEROBIC AND ANAEROBIC   Special Requests      Blood Culture adequate volume Performed at [facility], [address] [place], [place] [address]    Culture PENDING    Report Status PENDING   Blood Culture (routine x 2)   Specimen: BLOOD LEFT HAND  Result Value Ref Range   Specimen Description      BLOOD LEFT HAND BOTTLES DRAWN AEROBIC AND ANAEROBIC   Special Requests      Blood Culture adequate volume Performed at [facility], [address] [place], [place] [address]    Culture PENDING    Report Status PENDING   Lactic acid, plasma  Result Value Ref Range   Lactic Acid, Venous 1.0 0.5 - 1.9 mmol/L  Lactic acid, plasma  Result Value Ref Range   Lactic Acid, Venous 1.0 0.5 - 1.9 mmol/L  Comprehensive metabolic panel  Result Value Ref Range   Sodium 141 135 - 145 mmol/L   Potassium 3.1 (L) 3.5 - 5.1 mmol/L   Chloride 113 (H) 98 - 111 mmol/L   CO2 20 (L) 22 - 32 mmol/L   Glucose, Bld 142 (H) 70 - 99 mg/dL   BUN 20 6 - 20 mg/dL   Creatinine, Ser 0.96 0.44 - 1.00 mg/dL   Calcium 10.1 8.9 - 10.3 mg/dL   Total Protein 7.1 6.5 - 8.1 g/dL   Albumin 3.1 (L) 3.5 - 5.0 g/dL   AST 45 (H) 15 - 41 U/L   ALT 31 0 - 44 U/L   Alkaline Phosphatase 51 38 - 126 U/L   Total Bilirubin 0.6 0.3 - 1.2 mg/dL   GFR calc non Af Amer >60 >60 mL/min   GFR calc Af Amer >60 >60 mL/min   Anion gap 8 5 - 15  CBC WITH DIFFERENTIAL  Result Value Ref Range   WBC 8.9 4.0  - 10.5 K/uL   RBC 3.26 (L) 3.87 - 5.11 MIL/uL   Hemoglobin 10.5 (L) 12.0 - 15.0 g/dL   HCT 32.1 (L) 36.0 - 46.0 %   MCV 98.5 80.0 - 100.0 fL   MCH 32.2 26.0 - 34.0 pg   MCHC 32.7 30.0 - 36.0 g/dL   RDW 13.6 11.5 - 15.5 %   Platelets 234 150 - 400 K/uL   nRBC 0.0 0.0 - 0.2 %   Neutrophils Relative % 79 %   Neutro Abs 7.0 1.7 - 7.7 K/uL   Lymphocytes Relative 15 %   Lymphs Abs 1.3 0.7 - 4.0 K/uL   Monocytes Relative 5 %   Monocytes Absolute 0.5 0.1 - 1.0 K/uL   Eosinophils Relative 0 %   Eosinophils Absolute 0.0 0.0 -  0.5 K/uL   Basophils Relative 0 %   Basophils Absolute 0.0 0.0 - 0.1 K/uL   Immature Granulocytes 1 %   Abs Immature Granulocytes 0.05 0.00 - 0.07 K/uL  APTT  Result Value Ref Range   aPTT 38 (H) 24 - 36 seconds  Protime-INR  Result Value Ref Range   Prothrombin Time 15.4 (H) 11.4 - 15.2 seconds   INR 1.2 0.8 - 1.2  Urinalysis, Routine w reflex microscopic  Result Value Ref Range   Color, Urine YELLOW YELLOW   APPearance HAZY (A) CLEAR   Specific Gravity, Urine 1.035 (H) 1.005 - 1.030   pH 5.0 5.0 - 8.0   Glucose, UA NEGATIVE NEGATIVE mg/dL   Hgb urine dipstick SMALL (A) NEGATIVE   Bilirubin Urine NEGATIVE NEGATIVE   Ketones, ur 5 (A) NEGATIVE mg/dL   Protein, ur >=300 (A) NEGATIVE mg/dL   Nitrite NEGATIVE NEGATIVE   Leukocytes,Ua NEGATIVE NEGATIVE   RBC / HPF 0-5 0 - 5 RBC/hpf   WBC, UA 0-5 0 - 5 WBC/hpf   Bacteria, UA NONE SEEN NONE SEEN   Squamous Epithelial / LPF 0-5 0 - 5   Mucus PRESENT    Dg Chest Port 1 View Result Date: 10/01/2018 CLINICAL DATA:  58 year old female with history of cough. EXAM: PORTABLE CHEST 1 VIEW COMPARISON:  Chest x-ray 06/22/2013. FINDINGS: Patchy ill-defined opacities are noted throughout the lungs bilaterally, asymmetrically distributed, most severe throughout the mid to lower lungs. No definite pleural effusions. Heart size is normal. The patient is rotated to the left on today's exam, resulting in distortion of the  mediastinal contours and reduced diagnostic sensitivity and specificity for mediastinal pathology. Surgical clips project over the right infrahilar region. IMPRESSION: 1. The appearance the chest is highly concerning for multilobar bilateral pneumonia. Electronically Signed   By: Vinnie Langton M.D.   On: [id number]    Coco was evaluated in Emergency Department on 10/01/2018 for the symptoms described in the history of present illness. She was evaluated in the context of the global COVID-19 pandemic, which necessitated consideration that the patient might be at risk for infection with the SARS-CoV-2 virus that causes COVID-19. Institutional protocols and algorithms that pertain to the evaluation of patients at risk for COVID-19 are in a state of rapid change based on information released by regulatory bodies including the CDC and federal and state organizations. These policies and algorithms were followed during the patient's care in the ED.   1355:  Code Sepsis called on pt's arrival: BC, UC, IV abx started. APAP already given by EMS for fever; with improvement. IVF boluses x2 given for soft BP with improvement. Pt remains awake/alert.  O2 Sats improved from 82% R/A to 95% on O2 2L N/C. COVID+ with hypoxia: will admit. T/C returned from [facility]. Denton Brick, case discussed, including:  HPI, pertinent PM/SHx, VS/PE, dx testing, ED course and treatment:  Agreeable to admit.         Final Clinical Impressions(s) / ED Diagnoses   Final diagnoses:  None    ED Discharge Orders    None       Francine Graven, DO 10/04/18 1056

## 2018-10-01 NOTE — Progress Notes (Signed)
Patient arrived to room 136 in no distress. Dr. Loleta Books made aware. Orders checked and initiated. Will notify family of her arrival.

## 2018-10-01 NOTE — ED Notes (Signed)
Tried to contact husband but was unable to. Patient aware.

## 2018-10-01 NOTE — Progress Notes (Signed)
Pharmacy Note - Remdesivir Dosing  O:  ALT: 31 CXR: concerning for multilobar bilateral pneumonia. Requiring supplemental O2: 2L [place]   A/P:  Patient meets criteria for remdesivir.  Begin remdesivir 200 mg IV x 1, followed by 100 mg IV daily x 4 days  Monitor ALT, clinical progress  Peggyann Juba, PharmD, [place] [phone] 10/01/2018 6:40 PM

## 2018-10-01 NOTE — ED Triage Notes (Signed)
Pt brought in by [facility] with c/o fever and altered mental status that began at some time during the night. Her sitter reports pt was normal last night per EMS. Pt reports she is being treated for some kind of infection but altered and doesn't know what type per EMS. Pt has productive cough with green phlegm for EMS. Tylenol 1000mg  given by EMS for temporal fever of 104. 300 IV ns given, HR 130, BP 133/79 for EMS.

## 2018-10-01 NOTE — ED Notes (Signed)
Patient's blood sugar 43 with [facility] staff in room. EDP made aware, order given.

## 2018-10-02 DIAGNOSIS — J1289 Other viral pneumonia: Secondary | ICD-10-CM

## 2018-10-02 DIAGNOSIS — E11649 Type 2 diabetes mellitus with hypoglycemia without coma: Secondary | ICD-10-CM

## 2018-10-02 DIAGNOSIS — J069 Acute upper respiratory infection, unspecified: Secondary | ICD-10-CM

## 2018-10-02 DIAGNOSIS — U071 COVID-19: Principal | ICD-10-CM

## 2018-10-02 DIAGNOSIS — I10 Essential (primary) hypertension: Secondary | ICD-10-CM

## 2018-10-02 DIAGNOSIS — J9601 Acute respiratory failure with hypoxia: Secondary | ICD-10-CM

## 2018-10-02 LAB — COMPREHENSIVE METABOLIC PANEL
ALT: 50 U/L — ABNORMAL HIGH (ref 0–44)
AST: 74 U/L — ABNORMAL HIGH (ref 15–41)
Albumin: 3 g/dL — ABNORMAL LOW (ref 3.5–5.0)
Alkaline Phosphatase: 53 U/L (ref 38–126)
Anion gap: 9 (ref 5–15)
BUN: 15 mg/dL (ref 6–20)
CO2: 22 mmol/L (ref 22–32)
Calcium: 10.4 mg/dL — ABNORMAL HIGH (ref 8.9–10.3)
Chloride: 111 mmol/L (ref 98–111)
Creatinine, Ser: 0.69 mg/dL (ref 0.44–1.00)
GFR calc Af Amer: >60 mL/min (ref >60)
GFR calc non Af Amer: >60 mL/min (ref >60)
Glucose, Bld: 221 mg/dL — ABNORMAL HIGH (ref 70–99)
Potassium: 3.9 mmol/L (ref 3.5–5.1)
Sodium: 142 mmol/L (ref 135–145)
Total Bilirubin: 0.7 mg/dL (ref 0.3–1.2)
Total Protein: 7.4 g/dL (ref 6.5–8.1)

## 2018-10-02 LAB — CBC WITH DIFFERENTIAL/PLATELET
Abs Immature Granulocytes: 0.03 10*3/uL (ref 0.00–0.07)
Basophils Absolute: 0 10*3/uL (ref 0.0–0.1)
Basophils Relative: 0 %
Eosinophils Absolute: 0 10*3/uL (ref 0.0–0.5)
Eosinophils Relative: 0 %
HCT: 35 % — ABNORMAL LOW (ref 36.0–46.0)
Hemoglobin: 11.4 g/dL — ABNORMAL LOW (ref 12.0–15.0)
Immature Granulocytes: 0 %
Lymphocytes Relative: 13 %
Lymphs Abs: 1 10*3/uL (ref 0.7–4.0)
MCH: 32.1 pg (ref 26.0–34.0)
MCHC: 32.6 g/dL (ref 30.0–36.0)
MCV: 98.6 fL (ref 80.0–100.0)
Monocytes Absolute: 0.1 10*3/uL (ref 0.1–1.0)
Monocytes Relative: 2 %
Neutro Abs: 7.1 10*3/uL (ref 1.7–7.7)
Neutrophils Relative %: 85 %
Platelets: 262 10*3/uL (ref 150–400)
RBC: 3.55 MIL/uL — ABNORMAL LOW (ref 3.87–5.11)
RDW: 13.8 % (ref 11.5–15.5)
WBC: 8.3 10*3/uL (ref 4.0–10.5)
nRBC: 0 % (ref 0.0–0.2)

## 2018-10-02 LAB — GLUCOSE, CAPILLARY
Glucose-Capillary: 194 mg/dL — ABNORMAL HIGH (ref 70–99)
Glucose-Capillary: 174 mg/dL — ABNORMAL HIGH (ref 70–99)
Glucose-Capillary: 189 mg/dL — ABNORMAL HIGH (ref 70–99)
Glucose-Capillary: 260 mg/dL — ABNORMAL HIGH (ref 70–99)

## 2018-10-02 LAB — D-DIMER, QUANTITATIVE: D-Dimer, Quant: >20 ug/mL-FEU — ABNORMAL HIGH (ref 0.00–0.50)

## 2018-10-02 LAB — C-REACTIVE PROTEIN: CRP: 20 mg/dL — ABNORMAL HIGH (ref <1.0)

## 2018-10-02 LAB — FERRITIN: Ferritin: 1290 ng/mL — ABNORMAL HIGH (ref 11–307)

## 2018-10-02 LAB — HIV ANTIBODY (ROUTINE TESTING W REFLEX): HIV Screen 4th Generation wRfx: NONREACTIVE

## 2018-10-02 LAB — PROCALCITONIN: Procalcitonin: 6.18 ng/mL

## 2018-10-02 MED ORDER — VANCOMYCIN HCL IN DEXTROSE 1-5 GM/200ML-% IV SOLN
1000.0000 mg | INTRAVENOUS | Status: AC
  Administered 2018-10-02: 1000 mg via INTRAVENOUS
  Filled 2018-10-02: qty 200

## 2018-10-02 MED ORDER — VANCOMYCIN HCL IN DEXTROSE 1-5 GM/200ML-% IV SOLN
1000.0000 mg | Freq: Two times a day (BID) | INTRAVENOUS | Status: DC
  Administered 2018-10-03: 1000 mg via INTRAVENOUS
  Filled 2018-10-02 (×2): qty 200

## 2018-10-02 MED ORDER — LOPERAMIDE HCL 2 MG PO CAPS
2.0000 mg | ORAL_CAPSULE | ORAL | Status: DC | PRN
  Administered 2018-10-02 – 2018-10-03 (×3): 2 mg via ORAL
  Filled 2018-10-02 (×3): qty 1

## 2018-10-02 MED ORDER — DEXAMETHASONE SODIUM PHOSPHATE 10 MG/ML IJ SOLN
6.0000 mg | INTRAMUSCULAR | Status: DC
  Administered 2018-10-03 – 2018-10-07 (×5): 6 mg via INTRAVENOUS
  Filled 2018-10-02: qty 1
  Filled 2018-10-02: qty 0.6
  Filled 2018-10-02 (×3): qty 1

## 2018-10-02 MED ORDER — FAMOTIDINE 20 MG PO TABS
20.0000 mg | ORAL_TABLET | Freq: Every day | ORAL | Status: DC
  Administered 2018-10-02 – 2018-10-13 (×12): 20 mg via ORAL
  Filled 2018-10-02 (×13): qty 1

## 2018-10-02 MED ORDER — AMLODIPINE BESYLATE 5 MG PO TABS
10.0000 mg | ORAL_TABLET | Freq: Every day | ORAL | Status: DC
  Administered 2018-10-02 – 2018-10-07 (×6): 10 mg via ORAL
  Filled 2018-10-02 (×7): qty 2

## 2018-10-02 MED ORDER — ENOXAPARIN SODIUM 60 MG/0.6ML ~~LOC~~ SOLN
50.0000 mg | Freq: Two times a day (BID) | SUBCUTANEOUS | Status: DC
  Administered 2018-10-02 – 2018-10-13 (×22): 50 mg via SUBCUTANEOUS
  Filled 2018-10-02 (×22): qty 0.6

## 2018-10-02 MED ORDER — SODIUM CHLORIDE 0.9 % IV SOLN
2.0000 g | Freq: Three times a day (TID) | INTRAVENOUS | Status: DC
  Administered 2018-10-02 – 2018-10-06 (×11): 2 g via INTRAVENOUS
  Filled 2018-10-02 (×13): qty 2

## 2018-10-02 NOTE — Progress Notes (Addendum)
[place] TEAM 1 - Stepdown/ICU TEAM  Ragini Notaro  [id number] DOB: [birth year] DOA: 10/01/2018 PCP: Dion Body, MD    Brief Narrative:  58 YO with a history of DM 2, HTN, chronic pain syndrome, and morbid obesity who was brought by EMS to the ED at an 8 PM with 24 hours of altered mentation accompanied by fevers up to 104.  The patient had reported cough and upper respiratory symptoms for 2 to 3 days before the onset of the fever.  In the ED the patient had a temperature of 102.8 and was hypoxic at 82% on room air.  CXR noted bilateral multifocal infiltrates and a SARS-CoV-2 test was positive.  Significant Events: 9/9 approximate onset of symptoms 9/12 admit to [facility] via [facility]  COVID-19 specific Treatment: Decadron 9/12 > Remdesivir 9/12 >  Subjective: I have personally reviewed her chest x-ray from 9/12 and it is very worrisome with dense bilateral pulmonary infiltrates.  The patient herself is sitting up at the bedside and states she does not feel good in general.  She reports some modest shortness of breath.  She denies chest pain.  She reports poor appetite and ongoing diarrhea.  Assessment & Plan:  COVID pneumonia - acute hypoxic respiratory failure -possible bacterial pneumonia Saturations currently stable on modest O2 support -with markedly elevated procalcitonin will continue empiric antibiotics -continue steroid and Remdesivir  Recent Labs  Lab 10/01/18 1034 10/01/18 1158 10/02/18 0535  DDIMER  --  1.99* >20.00*  FERRITIN  --  >1,500* 1,290*  CRP  --  17.9* 20.0*  ALT 31  --  50*  PROCALCITON  --   --  6.18    COVID gastroenteritis -transaminitis Symptomatic management -monitor LFTs with ongoing use of Remdesivir  DM  DM 2 -uncontrolled with hypoglycemia CBG variable with some episodes of hypoglycemia -follow for now with sliding scale insulin  HTN Blood pressure poorly controlled -adjust treatment regimen and follow trend  Chronic pain  syndrome /depression Depression appears to be playing a role in her current illness -continue home medical therapy  Obesity - Estimated body mass index is 35.24 kg/m as calculated from the following:   Height as of this encounter: 5\' 7"  (1.702 m).   Weight as of this encounter: 102.1 kg.   DVT prophylaxis: Lovenox Code Status: FULL CODE Family Communication:  Disposition Plan: tele bed - begin PT/OT - mobilize   Consultants:  none  Antimicrobials:  Cefepime 9/12 > Vancomycin 9/12 > Flagyl 9/12  Objective: Blood pressure 131/80, pulse (!) 101, temperature 98.1 F (36.7 C), temperature source Oral, resp. rate (!) 25, height 5\' 7"  (1.702 m), weight 102.1 kg, SpO2 97 %.  Intake/Output Summary (Last 24 hours) at 10/02/2018 1616 Last data filed at 10/02/2018 1553 Gross per 24 hour  Intake 663 ml  Output 1550 ml  Net -887 ml   Filed Weights   10/01/18 0955 10/01/18 1216  Weight: 107.5 kg 102.1 kg    Examination: General: No acute respiratory distress Lungs: Fine diffuse crackles Cardiovascular: Regular rate and rhythm without murmur gallop or rub normal S1 and S2 Abdomen: Nontender, nondistended, soft, bowel sounds positive, no rebound, no ascites, no appreciable mass Extremities: No significant cyanosis, clubbing, or edema bilateral lower extremities  CBC: Recent Labs  Lab 10/01/18 1034 10/02/18 0535  WBC 8.9 8.3  NEUTROABS 7.0 7.1  HGB 10.5* 11.4*  HCT 32.1* 35.0*  MCV 98.5 98.6  PLT 234 [id number]   Basic Metabolic Panel: Recent Labs  Lab  10/01/18 1034 10/02/18 0535  NA 141 142  K 3.1* 3.9  CL 113* 111  CO2 20* 22  GLUCOSE 142* 221*  BUN 20 15  CREATININE 0.96 0.69  CALCIUM 10.1 10.4*   GFR: Estimated Creatinine Clearance: 96.4 mL/min (by C-G formula based on SCr of 0.69 mg/dL).  Liver Function Tests: Recent Labs  Lab 10/01/18 1034 10/02/18 0535  AST 45* 74*  ALT 31 50*  ALKPHOS 51 53  BILITOT 0.6 0.7  PROT 7.1 7.4  ALBUMIN 3.1* 3.0*     Coagulation Profile: Recent Labs  Lab 10/01/18 1034  INR 1.2    HbA1C: Hgb A1c MFr Bld  Date/Time Value Ref Range Status  05/29/2009 06:40 AM (H) <5.7 % Final   7.4 (NOTE)                                                                       According to the ADA Clinical Practice Recommendations for 2011, when HbA1c is used as a screening test:   >=6.5%   Diagnostic of Diabetes Mellitus           (if abnormal result  is confirmed)  5.7-6.4%   Increased risk of developing Diabetes Mellitus  References:Diagnosis and Classification of Diabetes Mellitus,Diabetes [id number] 1):S62-S69 and Standards of Medical Care in         Diabetes - 2011,Diabetes [id number]  (Suppl 1):S11-S61.    CBG: Recent Labs  Lab 10/01/18 1720 10/02/18 0759 10/02/18 1135  GLUCAP 43* 194* 174*    Recent Results (from the past 240 hour(s))  SARS Coronavirus 2 [facility] order, Performed in [facility] hospital lab) Nasopharyngeal Nasopharyngeal Swab     Status: Abnormal   Collection Time: 10/01/18  9:50 AM   Specimen: Nasopharyngeal Swab  Result Value Ref Range Status   SARS Coronavirus 2 POSITIVE (A) NEGATIVE Final    Comment: RESULT CALLED TO, READ BACK BY AND VERIFIED WITH: MCMANUS @ [id number] ON [id number] BY HENDERSON L. (NOTE) If result is NEGATIVE SARS-CoV-2 target nucleic acids are NOT DETECTED. The SARS-CoV-2 RNA is generally detectable in upper and lower  respiratory specimens during the acute phase of infection. The lowest  concentration of SARS-CoV-2 viral copies this assay can detect is 250  copies / mL. A negative result does not preclude SARS-CoV-2 infection  and should not be used as the sole basis for treatment or other  patient management decisions.  A negative result may occur with  improper specimen collection / handling, submission of specimen other  than nasopharyngeal swab, presence of viral mutation(s) within the  areas targeted by this assay, and inadequate number of viral  copies  (<250 copies / mL). A negative result must be combined with clinical  observations, patient history, and epidemiological information. If result is POSITIVE SARS-CoV-2 target nucleic acids are DETEC TED. The SARS-CoV-2 RNA is generally detectable in upper and lower  respiratory specimens during the acute phase of infection.  Positive  results are indicative of active infection with SARS-CoV-2.  Clinical  correlation with patient history and other diagnostic information is  necessary to determine patient infection status.  Positive results do  not rule out bacterial infection or co-infection with other viruses. If result is PRESUMPTIVE POSTIVE SARS-CoV-2 nucleic acids MAY  BE PRESENT.   A presumptive positive result was obtained on the submitted specimen  and confirmed on repeat testing.  While 2019 novel coronavirus  (SARS-CoV-2) nucleic acids may be present in the submitted sample  additional confirmatory testing may be necessary for epidemiological  and / or clinical management purposes  to differentiate between  SARS-CoV-2 and other Sarbecovirus currently known to infect humans.  If clinically indicated additional testing with an alternate test  methodology (LAB7 453) is advised. The SARS-CoV-2 RNA is generally  detectable in upper and lower respiratory specimens during the acute  phase of infection. The expected result is Negative. Fact Sheet for Patients:  StrictlyIdeas.no Fact Sheet for Healthcare Providers: BankingDealers.co.za This test is not yet approved or cleared by the [place] FDA and has been authorized for detection and/or diagnosis of SARS-CoV-2 by FDA under an Emergency Use Authorization (EUA).  This EUA will remain in effect (meaning this test can be used) for the duration of the COVID-19 declaration under Section 564(b)(1) of the Act, 21 U.S.C. section 360bbb-3(b)(1), unless the authorization is terminated  or revoked sooner. Performed at [facility], [address], [place], [place] [address]   Blood Culture (routine x 2)     Status: None (Preliminary result)   Collection Time: 10/01/18 10:36 AM   Specimen: BLOOD RIGHT HAND  Result Value Ref Range Status   Specimen Description   Final    BLOOD RIGHT HAND BOTTLES DRAWN AEROBIC AND ANAEROBIC   Special Requests Blood Culture adequate volume  Final   Culture   Final    NO GROWTH 1 DAY Performed at [facility], [address] [place], [place] [address]    Report Status PENDING  Incomplete  Blood Culture (routine x 2)     Status: None (Preliminary result)   Collection Time: 10/01/18 10:36 AM   Specimen: BLOOD LEFT HAND  Result Value Ref Range Status   Specimen Description   Final    BLOOD LEFT HAND BOTTLES DRAWN AEROBIC AND ANAEROBIC   Special Requests Blood Culture adequate volume  Final   Culture   Final    NO GROWTH 1 DAY Performed at [facility], [address], [place], [place] [address]    Report Status PENDING  Incomplete     Scheduled Meds: . amLODipine  10 mg Oral Daily  . [START ON 10/04/2018] buprenorphine  1 patch Transdermal Weekly  . [START ON 10/03/2018] dexamethasone (DECADRON) injection  6 mg Intravenous Q24H  . enoxaparin (LOVENOX) injection  50 mg Subcutaneous Q24H  . famotidine  20 mg Oral Daily  . folic acid  1 mg Oral Daily  . insulin aspart  0-20 Units Subcutaneous TID WC  . ipratropium  2 puff Inhalation Q6H  . methylPREDNISolone (SOLU-MEDROL) injection  0.5 mg/kg Intravenous Q12H  . metoprolol succinate  50 mg Oral BID  . multivitamin with minerals  1 tablet Oral Daily  . oxybutynin  10 mg Oral Daily  . pregabalin  150 mg Oral QID  . sodium chloride flush  3 mL Intravenous Q12H  . thiamine  100 mg Oral Daily  . topiramate  100 mg Oral BID  . venlafaxine XR  37.5 mg Oral QHS  . vitamin C  500 mg Oral Daily  . zinc sulfate  220 mg Oral Daily   Continuous Infusions: . sodium chloride    . remdesivir  100 mg in NS 250 mL       LOS: 1 day   Cherene Altes,  MD [facility] Office  [phone] Pager - Text Page per Amion  If 7PM-7AM, please contact night-coverage per Amion 10/02/2018, 4:16 PM

## 2018-10-02 NOTE — Evaluation (Signed)
Occupational Therapy Evaluation Patient Details Name: Gina Hill MRN: [id number] DOB: 1960-06-11 Today's Date: 10/02/2018    History of Present Illness 58 yo female presenting to ED with AMS and fever. Tested COVID positive. PMH including DM, depression, chronic pain syndrome, breast cancer, HTN, morbid obesity, and prior back sx (per pt report).    Clinical Impression   PTA, pt was living with her husband and was performing BADLs and using RW for mobility; has PCA each day to assist with IADLs. Pt currently requires Min A for LB ADLs, toileting, and functional mobility with RW. Pt presenting with decreased balance in standing with left lateral leaning requiring Min A for postural corrections. Pt also presenting with poor strength, cognition, safety, and activity tolerance. Episode of dizziness in standing at sink; BP stable. Pt would benefit from further acute OT to facilitate safe dc. Recommend dc to SNF for further OT to optimize safety, independence with ADLs, and return to PLOF.   SpO2 >89% on RA. HR 90s-100s. RR 20-30s.    Follow Up Recommendations  SNF;Supervision/Assistance - 24 hour(May progress to home with [facility])    Equipment Recommendations  Other (comment)(Defer to next venue)    Recommendations for Other Services PT consult     Precautions / Restrictions Precautions Precautions: Fall      Mobility Bed Mobility               General bed mobility comments: Sitting at [facility] upon arrival  Transfers Overall transfer level: Needs assistance Equipment used: Rolling walker (2 wheeled) Transfers: Sit to/from Stand Sit to Stand: Min assist         General transfer comment: Min A to gain balance in standing    Balance Overall balance assessment: Needs assistance Sitting-balance support: No upper extremity supported;Feet supported Sitting balance-Leahy Scale: Fair     Standing balance support: No upper extremity supported;During functional activity Standing  balance-Leahy Scale: Fair Standing balance comment: Lateral leaning to the left in standing requiring Min A for postural corrections                           ADL either performed or assessed with clinical judgement   ADL Overall ADL's : Needs assistance/impaired Eating/Feeding: Set up;Sitting Eating/Feeding Details (indicate cue type and reason): Encouraged pt to eat lunch. Pt with decreased appetite Grooming: Oral care;Min guard;Minimal assistance;Standing Grooming Details (indicate cue type and reason): Pt performing oral care and hand hygiene while standing at sink. Min Guard A for safety. However, requiring Min A for correction of left lateral lean showing poor awareness, balance, and safety Upper Body Bathing: Set up;Sitting;Supervision/ safety   Lower Body Bathing: Minimal assistance;Sit to/from stand   Upper Body Dressing : Set up;Supervision/safety;Sitting   Lower Body Dressing: Minimal assistance;Sit to/from stand   Toilet Transfer: Minimal assistance;RW;Ambulation;[employer] forces technical officer Details (indicate cue type and reason): Min A to gain balance in standing Toileting- Clothing Manipulation and Hygiene: Minimal assistance;Sit to/from stand Toileting - Clothing Manipulation Details (indicate cue type and reason): Pt performing peri care with Min Guard A. Requesting to make sure she is clean after BM needing Min A.    Tub/Shower Transfer Details (indicate cue type and reason): ' Functional mobility during ADLs: Min guard;Rolling walker General ADL Comments: Pt presenting with poor cognition, safety, balance, strength, and activity tolerance     Vision Baseline Vision/History: Wears glasses Wears Glasses: At all times Patient Visual Report: No change from baseline  Perception     Praxis      Pertinent Vitals/Pain Pain Assessment: Faces Faces Pain Scale: Hurts little more Pain Location: Back Pain Descriptors / Indicators: Discomfort;Grimacing Pain  Intervention(s): Monitored during session;Repositioned     Hand Dominance Right   Extremity/Trunk Assessment Upper Extremity Assessment Upper Extremity Assessment: Generalized weakness   Lower Extremity Assessment Lower Extremity Assessment: Defer to PT evaluation   Cervical / Trunk Assessment Cervical / Trunk Assessment: Other exceptions Cervical / Trunk Exceptions: Increased body habitus. Prior back sx   Communication Communication Communication: Other (comment)(Slow and soft speech)   Cognition Arousal/Alertness: Awake/alert(drowsy) Behavior During Therapy: Flat affect Overall Cognitive Status: Impaired/Different from baseline Area of Impairment: Attention;Following commands;Awareness;Problem solving;Safety/judgement                   Current Attention Level: Sustained(distracted by conversation in environment)   Following Commands: Follows one step commands inconsistently;Follows one step commands with increased time Safety/Judgement: Decreased awareness of safety;Decreased awareness of deficits Awareness: Emergent Problem Solving: Slow processing;Requires verbal cues General Comments: Pt presenting with slow processing and speech. Noting tangiental thoughts during conversation. Pt presenting with poor awareness, safety, problem solving, and attention. Pt with increased speech pattern speed when she begand talking with her granddaughter showing slight inconsistency.    General Comments  Daughter facetiming upon arrival and pt requested she continued to stay on phone during session. SpO2 >89% on RA. HR 90-100s. RR 30s.     Exercises     Shoulder Instructions      Home Living Family/patient expects to be discharged to:: Private residence Living Arrangements: Spouse/significant other;Children Available Help at Discharge: Family;Personal care attendant(Husband works during the day; caregivers Mon-Fri 8 hrs) Type of Home: House       Home Layout: One level                Home Equipment: Walker - 2 wheels          Prior Functioning/Environment Level of Independence: Independent with assistive device(s)        Comments: Pt reporting she uses RW at home. She also reports she performs BADLs and PCA performs IADLs and driving. Daughter (facetiming patient during session) reports that pt will stay in bed during the day and her blood sugar with drop; she will get OOB to eat dinner with her husband when he returned home in the evenings.        OT Problem List: Decreased strength;Decreased range of motion;Decreased activity tolerance;Impaired balance (sitting and/or standing);Decreased safety awareness;Decreased knowledge of use of DME or AE;Decreased knowledge of precautions;Decreased cognition;Pain      OT Treatment/Interventions: Self-care/ADL training;Therapeutic exercise;Energy conservation;DME and/or AE instruction;Therapeutic activities;Patient/family education    OT Goals(Current goals can be found in the care plan section) Acute Rehab OT Goals Patient Stated Goal: "Go home" OT Goal Formulation: With patient Time For Goal Achievement: 10/16/18 Potential to Achieve Goals: Good  OT Frequency: Min 2X/week   Barriers to D/C:            Co-evaluation              AM-PAC OT "6 Clicks" Daily Activity     Outcome Measure Help from another person eating meals?: None Help from another person taking care of personal grooming?: A Little Help from another person toileting, which includes using toliet, bedpan, or urinal?: A Little Help from another person bathing (including washing, rinsing, drying)?: A Little Help from another person to put on and taking off regular upper  body clothing?: None Help from another person to put on and taking off regular lower body clothing?: A Little 6 Click Score: 20   End of Session Equipment Utilized During Treatment: Rolling walker Nurse Communication: Mobility status  Activity Tolerance:  Patient tolerated treatment well Patient left: in chair;with call bell/phone within reach;with chair alarm set;with nursing/sitter in room  OT Visit Diagnosis: Unsteadiness on feet (R26.81);Other abnormalities of gait and mobility (R26.89);Muscle weakness (generalized) (M62.81);Other symptoms and signs involving cognitive function;Pain Pain - part of body: (Back)                Time: 1341-1419 OT Time Calculation (min): 38 min Charges:  OT General Charges $OT Visit: 1 Visit OT Evaluation $OT Eval Moderate Complexity: 1 Mod OT Treatments $Self Care/Home Management : 23-37 mins  Kaelin Bonelli MSOT, OTR/L Acute Rehab Pager: [phone] Office: [place] 10/02/2018, 4:04 PM

## 2018-10-02 NOTE — Progress Notes (Signed)
Pharmacy Antibiotic Note  Gina Hill is a 58 y.o. female admitted on 10/01/2018 with COVID-19 pneumonia and elevated PCT 6.18 concerning for bacterial infection.  Pharmacy has been consulted for Vancomycin dosing.  Plan:  Cefepime 2g IV q8h  Vancomycin 1g IV q12h.  Measure Vanc peak and trough at steady state.  Goal AUC = 400 - 550.  Est AUC 509 using SCr 0.69  Follow up renal function, culture results, and clinical course.  Height: 5\' 7"  (170.2 cm) Weight: 225 lb (102.1 kg) IBW/kg (Calculated) : 61.6  Temp (24hrs), Avg:98.7 F (37.1 C), Min:98.1 F (36.7 C), Max:99.8 F (37.7 C)  Recent Labs  Lab 10/01/18 1034 10/01/18 1158 10/02/18 0535  WBC 8.9  --  8.3  CREATININE 0.96  --  0.69  LATICACIDVEN 1.0 1.0  --     Estimated Creatinine Clearance: 96.4 mL/min (by C-G formula based on SCr of 0.69 mg/dL).    Allergies  Allergen Reactions  . Gadolinium Derivatives Other (See Comments)    Only PO contrast causes confusion and diarrhea  . Iodine Diarrhea  . Medroxyprogesterone Nausea And Vomiting and Other (See Comments)    Does not want to take hormones   . Tape Other (See Comments)    Tears skin - paper tape OK  . Codeine Nausea And Vomiting     Thank you for allowing pharmacy to be a part of this patient's care.  Gretta Arab PharmD, BCPS Clinical pharmacist phone 7am- 5pm: [phone] 10/02/2018 4:41 PM

## 2018-10-02 NOTE — Progress Notes (Signed)
Patient reports she cannot find her left gold hoop earring, which she reports she came into the hospital with in place.  Upon assessment, her LEFT earrings present (x2), small thick silver hoop to second earring place, small gold hoop to upper left ear. RN confirms that no earring is seen to left ear first earring hole, which is the gold hoop earring she reports missing.  RIGHT earrings present (x2), one thin, large gold hoop to first earring hole, along with upper earring- thin silver hoop with small purple pearl attached to earring.   Nursing searched bed, floor, bedside table, sink counter, dirty linen bag, and window seal. Unfortunately, the missing thin gold hoop earring could not be found.  Will continue to search, and have second and third staff members also assist with room search.    10/02/18 0910  Patient belongings (Short Stay/ED/MAU Only)  Belongings at Bedside Other (Comment);Jewelry (L pinky ring diamond. Earings (x2 right ear, x3 left ear))

## 2018-10-03 ENCOUNTER — Inpatient Hospital Stay (HOSPITAL_COMMUNITY): Payer: Medicare Other

## 2018-10-03 DIAGNOSIS — A0839 Other viral enteritis: Secondary | ICD-10-CM

## 2018-10-03 LAB — URINE CULTURE: Culture: NO GROWTH

## 2018-10-03 LAB — COMPREHENSIVE METABOLIC PANEL
ALT: 46 U/L — ABNORMAL HIGH (ref 0–44)
AST: 35 U/L (ref 15–41)
Albumin: 3.1 g/dL — ABNORMAL LOW (ref 3.5–5.0)
Alkaline Phosphatase: 53 U/L (ref 38–126)
Anion gap: 11 (ref 5–15)
BUN: 23 mg/dL — ABNORMAL HIGH (ref 6–20)
CO2: 22 mmol/L (ref 22–32)
Calcium: 10.6 mg/dL — ABNORMAL HIGH (ref 8.9–10.3)
Chloride: 109 mmol/L (ref 98–111)
Creatinine, Ser: 0.73 mg/dL (ref 0.44–1.00)
GFR calc Af Amer: >60 mL/min (ref >60)
GFR calc non Af Amer: >60 mL/min (ref >60)
Glucose, Bld: 309 mg/dL — ABNORMAL HIGH (ref 70–99)
Potassium: 3.3 mmol/L — ABNORMAL LOW (ref 3.5–5.1)
Sodium: 142 mmol/L (ref 135–145)
Total Bilirubin: 0.9 mg/dL (ref 0.3–1.2)
Total Protein: 7.4 g/dL (ref 6.5–8.1)

## 2018-10-03 LAB — CBC WITH DIFFERENTIAL/PLATELET
Abs Immature Granulocytes: 0.07 10*3/uL (ref 0.00–0.07)
Basophils Absolute: 0 10*3/uL (ref 0.0–0.1)
Basophils Relative: 0 %
Eosinophils Absolute: 0 10*3/uL (ref 0.0–0.5)
Eosinophils Relative: 0 %
HCT: 35 % — ABNORMAL LOW (ref 36.0–46.0)
Hemoglobin: 11.6 g/dL — ABNORMAL LOW (ref 12.0–15.0)
Immature Granulocytes: 1 %
Lymphocytes Relative: 12 %
Lymphs Abs: 1.1 10*3/uL (ref 0.7–4.0)
MCH: 32.2 pg (ref 26.0–34.0)
MCHC: 33.1 g/dL (ref 30.0–36.0)
MCV: 97.2 fL (ref 80.0–100.0)
Monocytes Absolute: 0.4 10*3/uL (ref 0.1–1.0)
Monocytes Relative: 4 %
Neutro Abs: 7.8 10*3/uL — ABNORMAL HIGH (ref 1.7–7.7)
Neutrophils Relative %: 83 %
Platelets: 305 10*3/uL (ref 150–400)
RBC: 3.6 MIL/uL — ABNORMAL LOW (ref 3.87–5.11)
RDW: 13.8 % (ref 11.5–15.5)
WBC: 9.4 10*3/uL (ref 4.0–10.5)
nRBC: 0 % (ref 0.0–0.2)

## 2018-10-03 LAB — C-REACTIVE PROTEIN: CRP: 11.1 mg/dL — ABNORMAL HIGH (ref <1.0)

## 2018-10-03 LAB — GLUCOSE, CAPILLARY
Glucose-Capillary: 141 mg/dL — ABNORMAL HIGH (ref 70–99)
Glucose-Capillary: 127 mg/dL — ABNORMAL HIGH (ref 70–99)
Glucose-Capillary: 346 mg/dL — ABNORMAL HIGH (ref 70–99)
Glucose-Capillary: 268 mg/dL — ABNORMAL HIGH (ref 70–99)
Glucose-Capillary: 241 mg/dL — ABNORMAL HIGH (ref 70–99)
Glucose-Capillary: 239 mg/dL — ABNORMAL HIGH (ref 70–99)

## 2018-10-03 LAB — FERRITIN: Ferritin: 2140 ng/mL — ABNORMAL HIGH (ref 11–307)

## 2018-10-03 LAB — D-DIMER, QUANTITATIVE: D-Dimer, Quant: 5.24 ug/mL-FEU — ABNORMAL HIGH (ref 0.00–0.50)

## 2018-10-03 MED ORDER — INSULIN GLARGINE 100 UNIT/ML ~~LOC~~ SOLN
10.0000 [IU] | Freq: Every day | SUBCUTANEOUS | Status: DC
  Administered 2018-10-03: 10 [IU] via SUBCUTANEOUS
  Filled 2018-10-03 (×2): qty 0.1

## 2018-10-03 MED ORDER — POTASSIUM CHLORIDE CRYS ER 20 MEQ PO TBCR
40.0000 meq | EXTENDED_RELEASE_TABLET | Freq: Once | ORAL | Status: AC
  Administered 2018-10-03: 40 meq via ORAL
  Filled 2018-10-03: qty 2

## 2018-10-03 MED ORDER — INSULIN GLARGINE 100 UNIT/ML ~~LOC~~ SOLN
18.0000 [IU] | Freq: Every day | SUBCUTANEOUS | Status: DC
  Administered 2018-10-04: 18 [IU] via SUBCUTANEOUS
  Filled 2018-10-03: qty 0.18

## 2018-10-03 NOTE — Evaluation (Signed)
Physical Therapy Evaluation Patient Details Name: Gina Hill MRN: [id number] DOB: September 16, 1960 Today's Date: 10/03/2018   History of Present Illness  58 yo female presenting to ED with AMS and fever. Tested COVID positive. PMH including DM, depression, chronic pain syndrome, breast cancer, HTN, morbid obesity, and prior back sx (per pt report).   Clinical Impression  The patient  Presents with decrease speech volume, required multimodal cues to stay on task. [facility]. Patient declined ambulation at the time. Patient does report significant neuropathy. Patient should progress to return home with family. .Pt admitted with above diagnosis.  Pt currently with functional limitations due to the deficits listed below (see PT Problem List). Pt will benefit from skilled PT to increase their independence and safety with mobility to allow discharge to the venue listed below.   Patient is on RA with SPO2 > 90%    Follow Up Recommendations No PT follow up    Equipment Recommendations  None recommended by PT    Recommendations for Other Services       Precautions / Restrictions Precautions Precautions: Fall      Mobility  Bed Mobility Overal bed mobility: Modified Independent             General bed mobility comments: extra time  to get legs over bed edge, reports  getting to [facility] independently. Cutioned her to call for assistance  Transfers Overall transfer level: Needs assistance   Transfers: Sit to/from [employer] Transfers Sit to Stand: Min guard Stand pivot transfers: Min guard       General transfer comment: extra time to initiate task, cues  for staying/reengaging in task as she sat on [facility] for many minutes without moving., stnad and ivot from bed to [facility] to bed, holding onto armrests and leaning onto bed,never fully stood up  Ambulation/Gait                Stairs            Wheelchair Mobility    Modified Rankin (Stroke Patients Only)        Balance Overall balance assessment: Needs assistance Sitting-balance support: No upper extremity supported;Feet supported Sitting balance-Leahy Scale: Fair     Standing balance support: Single extremity supported;During functional activity Standing balance-Leahy Scale: Fair Standing balance comment: stood and leaned on bed to perform pericare.                             Pertinent Vitals/Pain Faces Pain Scale: Hurts even more Pain Location: feet, back Pain Descriptors / Indicators: Discomfort;Grimacing Pain Intervention(s): Monitored during session    Home Living Family/patient expects to be discharged to:: Private residence Living Arrangements: Spouse/significant other;Children Available Help at Discharge: Family;Personal care attendant Type of Home: House   Entrance Stairs-Rails: None Entrance Stairs-Number of Steps: 2 Home Layout: One level Home Equipment: Walker - 2 wheels Additional Comments: pt not very talkative and did not answer about the shower seat, ask again    Prior Function Level of Independence: Independent with assistive device(s)         Comments: Pt reporting she uses RW at home. She also reports she performs BADLs and PCA performs IADLs and driving. Daughter (facetiming patient during session) reports that pt will stay in bed during the day and her blood sugar with drop; she will get OOB to eat dinner with her husband when he returned home in the evenings.     Hand  Dominance   Dominant Hand: Right    Extremity/Trunk Assessment   Upper Extremity Assessment Upper Extremity Assessment: Generalized weakness    Lower Extremity Assessment Lower Extremity Assessment: Generalized weakness;RLE deficits/detail;LLE deficits/detail RLE Sensation: history of peripheral neuropathy LLE Sensation: history of peripheral neuropathy       Communication   Communication: Other (comment)(speech is very low, difficult to hear her)  Cognition  Arousal/Alertness: Awake/alert Behavior During Therapy: Flat affect Overall Cognitive Status: Impaired/Different from baseline Area of Impairment: Attention;Following commands;Awareness;Problem solving;Safety/judgement                   Current Attention Level: Sustained   Following Commands: Follows one step commands inconsistently;Follows one step commands with increased time Safety/Judgement: Decreased awareness of safety;Decreased awareness of deficits Awareness: Emergent Problem Solving: Slow processing;Requires verbal cues General Comments: Pt presenting with slow processing and speech.Frequently just stopped  doing task and sat, required stimulation to reengage in task. Pt presenting with poor awareness, safety, problem solving, and attention. Pt with increased speech pattern speed when she began to talk on phone      General Comments      Exercises     Assessment/Plan    PT Assessment Patient needs continued PT services  PT Problem List Decreased strength;Decreased mobility;Decreased safety awareness;Decreased activity tolerance;Decreased cognition;Decreased balance;Decreased knowledge of use of DME;Obesity       PT Treatment Interventions DME instruction;Therapeutic activities;Gait training;Therapeutic exercise;Patient/family education;Functional mobility training;Stair training    PT Goals (Current goals can be found in the Care Plan section)  Acute Rehab PT Goals Patient Stated Goal: "Go home" PT Goal Formulation: With patient Time For Goal Achievement: 10/17/18 Potential to Achieve Goals: Good    Frequency Min 3X/week   Barriers to discharge        Co-evaluation               AM-PAC PT "6 Clicks" Mobility  Outcome Measure Help needed turning from your back to your side while in a flat bed without using bedrails?: None Help needed moving from lying on your back to sitting on the side of a flat bed without using bedrails?: None Help needed  moving to and from a bed to a chair (including a wheelchair)?: A Little Help needed standing up from a chair using your arms (e.g., wheelchair or bedside chair)?: A Little Help needed to walk in hospital room?: A Lot Help needed climbing 3-5 steps with a railing? : A Lot 6 Click Score: 18    End of Session   Activity Tolerance: Patient limited by fatigue Patient left: in bed(siting on bed edge for breakfast) Nurse Communication: Mobility status PT Visit Diagnosis: Unsteadiness on feet (R26.81);Muscle weakness (generalized) (M62.81);Other symptoms and signs involving the nervous system (R29.898);Difficulty in walking, not elsewhere classified (R26.2)    Time: 0752-0826 PT Time Calculation (min) (ACUTE ONLY): 34 min   Charges:   PT Evaluation $PT Eval Moderate Complexity: 1 Mod PT Treatments $Therapeutic Activity: 8-22 mins        Tresa Endo PT Acute Rehabilitation Services Pager [phone] Office [phone]   Claretha Cooper 10/03/2018, 1:40 PM

## 2018-10-03 NOTE — Progress Notes (Signed)
Gina Hill TEAM 1 - Stepdown/ICU TEAM  Gina Hill  [id number] DOB: 1960-01-21 DOA: 10/01/2018 PCP: Dion Body, MD    Brief [id number] with a history of DM 2, HTN, chronic pain syndrome, and morbid obesity who was brought by EMS to the ED with 24 hours of altered mentation accompanied by fevers up to 104.  The patient had reported cough and upper respiratory symptoms for 2 to 3 days before the onset of the fever.  In the ED the patient had a temperature of 102.8 and was hypoxic at 82% on room air.  CXR noted bilateral multifocal infiltrates and a SARS-CoV-2 test was positive.  Significant Events: 9/9 approximate onset of symptoms 9/12 admit to [facility] via [facility]  COVID-19 specific Treatment: Decadron 9/12 > Remdesivir 9/12 >  Subjective: Oxygen saturations appear to be holding relatively stable on room air.  I have personally reviewed her chest x-ray this morning which suggests improvement in bilateral pulmonary infiltrates. Much more alert today, though affect remains quite flat. C/o ongoing diarrhea, cough, crampy abdom pain, and diffuse musculoskeletal type pain in the chest made worse by ongoing coughing.   Assessment & Plan:  COVID pneumonia - acute hypoxic respiratory failure -possible bacterial pneumonia Has been weaned to room air - with markedly elevated procalcitonin will continue empiric antibiotics and recheck in AM - continue steroid and Remdesivir -appears to be slowly improving  Recent Labs  Lab 10/01/18 1034 10/01/18 1158 10/02/18 0535 10/03/18 0304  DDIMER  --  1.99* >20.00* 5.24*  FERRITIN  --  >1,500* 1,290* 2,140*  CRP  --  17.9* 20.0* 11.1*  ALT 31  --  50* 31*  PROCALCITON  --   --  6.18  --     COVID gastroenteritis -transaminitis Symptomatic management -monitor LFTs with ongoing use of Remdesivir -symptoms are persisting without significant improvement thus far   DM 2 - uncontrolled with hypoglycemia CBG remains significantly  elevated -adjust treatment and follow  Mild hypokalemia Supplement -check magnesium in a.m.  HTN Blood pressure poorly controlled -adjust treatment regimen and follow trend  Chronic pain syndrome /depression Depression appears to be playing a role in her current illness -continue home medical therapy  Obesity - Estimated body mass index is 35.24 kg/m as calculated from the following:   Height as of this encounter: 5\' 7"  (1.702 m).   Weight as of this encounter: 102.1 kg.   DVT prophylaxis: Lovenox Code Status: FULL CODE Family Communication:  Disposition Plan: MedSurg bed- begin PT/OT - mobilize   Consultants:  none  Antimicrobials:  Cefepime 9/12 > Vancomycin 9/12 > Flagyl 9/12  Objective: Blood pressure 138/87, pulse 83, temperature 97.9 F (36.6 C), temperature source Oral, resp. rate 14, height 5\' 7"  (1.702 m), weight 102.1 kg, SpO2 92 %.  Intake/Output Summary (Last 24 hours) at 10/03/2018 0819 Last data filed at 10/03/2018 0300 Gross per 24 hour  Intake 1988.55 ml  Output -  Net 1988.55 ml   Filed Weights   10/01/18 0955 10/01/18 1216  Weight: 107.5 kg 102.1 kg    Examination: General: No acute respiratory distress Lungs: Fine diffuse crackles -no wheezing Cardiovascular: RRR Abdomen: Mildly tender diffusely, soft, no rebound Extremities: No significant edema bilateral lower extremities  CBC: Recent Labs  Lab 10/01/18 1034 10/02/18 0535 10/03/18 0304  WBC 8.9 8.3 9.4  NEUTROABS 7.0 7.1 7.8*  HGB 10.5* 11.4* 11.6*  HCT 32.1* 35.0* 35.0*  MCV 98.5 98.6 97.2  PLT 234 262 305  Basic Metabolic Panel: Recent Labs  Lab 10/01/18 1034 10/02/18 0535 10/03/18 0304  NA 141 142 142  K 3.1* 3.9 3.3*  CL 113* 111 109  CO2 20* 22 22  GLUCOSE 142* 221* 309*  BUN 20 15 23*  CREATININE 0.96 0.69 0.73  CALCIUM 10.1 10.4* 10.6*   GFR: Estimated Creatinine Clearance: 96.4 mL/min (by C-G formula based on SCr of 0.73 mg/dL).  Liver Function Tests:  Recent Labs  Lab 10/01/18 1034 10/02/18 0535 10/03/18 0304  AST 45* 74* 35  ALT 31 50* 46*  ALKPHOS 51 53 53  BILITOT 0.6 0.7 0.9  PROT 7.1 7.4 7.4  ALBUMIN 3.1* 3.0* 3.1*    Coagulation Profile: Recent Labs  Lab 10/01/18 1034  INR 1.2    HbA1C: Hgb A1c MFr Bld  Date/Time Value Ref Range Status  05/29/2009 06:40 AM (H) <5.7 % Final   7.4 (NOTE)                                                                       According to the ADA Clinical Practice Recommendations for 2011, when HbA1c is used as a screening test:   >=6.5%   Diagnostic of Diabetes Mellitus           (if abnormal result  is confirmed)  5.7-6.4%   Increased risk of developing Diabetes Mellitus  References:Diagnosis and Classification of Diabetes Mellitus,Diabetes [id number] 1):S62-S69 and Standards of Medical Care in         Diabetes - 2011,Diabetes [id number]  (Suppl 1):S11-S61.    CBG: Recent Labs  Lab 10/01/18 1720 10/02/18 0759 10/02/18 1135 10/02/18 1617 10/02/18 2019  GLUCAP 43* 194* 174* 189* 260*    Recent Results (from the past 240 hour(s))  SARS Coronavirus 2 [facility] order, Performed in [facility] hospital lab) Nasopharyngeal Nasopharyngeal Swab     Status: Abnormal   Collection Time: 10/01/18  9:50 AM   Specimen: Nasopharyngeal Swab  Result Value Ref Range Status   SARS Coronavirus 2 POSITIVE (A) NEGATIVE Final    Comment: RESULT CALLED TO, READ BACK BY AND VERIFIED WITH: MCMANUS @ [id number] ON [id number] BY HENDERSON L. (NOTE) If result is NEGATIVE SARS-CoV-2 target nucleic acids are NOT DETECTED. The SARS-CoV-2 RNA is generally detectable in upper and lower  respiratory specimens during the acute phase of infection. The lowest  concentration of SARS-CoV-2 viral copies this assay can detect is 250  copies / mL. A negative result does not preclude SARS-CoV-2 infection  and should not be used as the sole basis for treatment or other  patient management decisions.  A negative  result may occur with  improper specimen collection / handling, submission of specimen other  than nasopharyngeal swab, presence of viral mutation(s) within the  areas targeted by this assay, and inadequate number of viral copies  (<250 copies / mL). A negative result must be combined with clinical  observations, patient history, and epidemiological information. If result is POSITIVE SARS-CoV-2 target nucleic acids are DETEC TED. The SARS-CoV-2 RNA is generally detectable in upper and lower  respiratory specimens during the acute phase of infection.  Positive  results are indicative of active infection with SARS-CoV-2.  Clinical  correlation with patient history and other diagnostic information is  necessary to determine patient infection status.  Positive results do  not rule out bacterial infection or co-infection with other viruses. If result is PRESUMPTIVE POSTIVE SARS-CoV-2 nucleic acids MAY BE PRESENT.   A presumptive positive result was obtained on the submitted specimen  and confirmed on repeat testing.  While 2019 novel coronavirus  (SARS-CoV-2) nucleic acids may be present in the submitted sample  additional confirmatory testing may be necessary for epidemiological  and / or clinical management purposes  to differentiate between  SARS-CoV-2 and other Sarbecovirus currently known to infect humans.  If clinically indicated additional testing with an alternate test  methodology ([id number] [id number]) is advised. The SARS-CoV-2 RNA is generally  detectable in upper and lower respiratory specimens during the acute  phase of infection. The expected result is Negative. Fact Sheet for Patients:  StrictlyIdeas.no Fact Sheet for Healthcare Providers: BankingDealers.co.za This test is not yet approved or cleared by the [place] FDA and has been authorized for detection and/or diagnosis of SARS-CoV-2 by FDA under an Emergency Use Authorization  (EUA).  This EUA will remain in effect (meaning this test can be used) for the duration of the COVID-19 declaration under Section 564(b)(1) of the Act, 21 U.S.C. section 360bbb-3(b)(1), unless the authorization is terminated or revoked sooner. Performed at [facility], [address], [place], [place] [address]   Urine culture     Status: None   Collection Time: 10/01/18  9:59 AM   Specimen: Urine, Catheterized  Result Value Ref Range Status   Specimen Description   Final    URINE, CATHETERIZED Performed at [facility], [address], [place], [place] [address]    Special Requests   Final    NONE Performed at [facility], [address], [place], [place] [address]    Culture   Final    NO GROWTH Performed at [facility], [place] [address], [place], [place] [address]    Report Status 10/03/2018 FINAL  Final  Blood Culture (routine x 2)     Status: None (Preliminary result)   Collection Time: 10/01/18 10:36 AM   Specimen: BLOOD RIGHT HAND  Result Value Ref Range Status   Specimen Description   Final    BLOOD RIGHT HAND BOTTLES DRAWN AEROBIC AND ANAEROBIC   Special Requests Blood Culture adequate volume  Final   Culture   Final    NO GROWTH 1 DAY Performed at [facility], [address], [place], [place] [address]    Report Status PENDING  Incomplete  Blood Culture (routine x 2)     Status: None (Preliminary result)   Collection Time: 10/01/18 10:36 AM   Specimen: BLOOD LEFT HAND  Result Value Ref Range Status   Specimen Description   Final    BLOOD LEFT HAND BOTTLES DRAWN AEROBIC AND ANAEROBIC   Special Requests Blood Culture adequate volume  Final   Culture   Final    NO GROWTH 1 DAY Performed at [facility], [address] [place], [place] [address]    Report Status PENDING  Incomplete     Scheduled Meds: . amLODipine  10 mg Oral Daily  . [START ON 10/04/2018] buprenorphine  1 patch Transdermal Weekly  . dexamethasone (DECADRON) injection  6 mg Intravenous  Q24H  . enoxaparin (LOVENOX) injection  50 mg Subcutaneous Q12H  . famotidine  20 mg Oral Daily  . folic acid  1 mg Oral Daily  . insulin aspart  0-20 Units Subcutaneous TID WC  . ipratropium  2 puff Inhalation Q6H  . metoprolol succinate  50 mg Oral BID  . multivitamin with minerals  1 tablet Oral Daily  . oxybutynin  10 mg Oral Daily  . pregabalin  150 mg Oral QID  . sodium chloride flush  3 mL Intravenous Q12H  . thiamine  100 mg Oral Daily  . topiramate  100 mg Oral BID  . venlafaxine XR  37.5 mg Oral QHS  . vitamin C  500 mg Oral Daily  . zinc sulfate  220 mg Oral Daily   Continuous Infusions: . sodium chloride    . ceFEPime (MAXIPIME) IV Stopped (10/03/18 0206)  . remdesivir 100 mg in NS 250 mL Stopped (10/02/18 2232)  . vancomycin       LOS: 2 days   Cherene Altes, MD [facility] Office  [phone] Pager - Text Page per Amion  If 7PM-7AM, please contact night-coverage per Amion 10/03/2018, 8:19 AM

## 2018-10-03 NOTE — Plan of Care (Signed)
Patient given am meds , well tolerated. No s/s of pain or distress. Updates patient daughter. Will continue to monitor for remainder of shift.    Problem: Education: Goal: Knowledge of risk factors and measures for prevention of condition will improve Outcome: Progressing   Problem: Coping: Goal: Psychosocial and spiritual needs will be supported Outcome: Progressing   Problem: Respiratory: Goal: Will maintain a patent airway Outcome: Progressing Goal: Complications related to the disease process, condition or treatment will be avoided or minimized Outcome: Progressing   Problem: Education: Goal: Knowledge of General Education information will improve Description: Including pain rating scale, medication(s)/side effects and non-pharmacologic comfort measures Outcome: Progressing   Problem: Health Behavior/Discharge Planning: Goal: Ability to manage health-related needs will improve Outcome: Progressing   Problem: Clinical Measurements: Goal: Ability to maintain clinical measurements within normal limits will improve Outcome: Progressing Goal: Will remain free from infection Outcome: Progressing Goal: Diagnostic test results will improve Outcome: Progressing Goal: Respiratory complications will improve Outcome: Progressing Goal: Cardiovascular complication will be avoided Outcome: Progressing   Problem: Activity: Goal: Risk for activity intolerance will decrease Outcome: Progressing   Problem: Nutrition: Goal: Adequate nutrition will be maintained Outcome: Progressing   Problem: Coping: Goal: Level of anxiety will decrease Outcome: Progressing   Problem: Elimination: Goal: Will not experience complications related to bowel motility Outcome: Progressing Goal: Will not experience complications related to urinary retention Outcome: Progressing   Problem: Pain Managment: Goal: General experience of comfort will improve Outcome: Progressing   Problem:  Safety: Goal: Ability to remain free from injury will improve Outcome: Progressing   Problem: Skin Integrity: Goal: Risk for impaired skin integrity will decrease Outcome: Progressing

## 2018-10-03 NOTE — Plan of Care (Signed)
Pt is progressing 

## 2018-10-03 NOTE — Progress Notes (Signed)
Educated  On incentive

## 2018-10-04 LAB — CBC WITH DIFFERENTIAL/PLATELET
Abs Immature Granulocytes: 0.05 10*3/uL (ref 0.00–0.07)
Basophils Absolute: 0 10*3/uL (ref 0.0–0.1)
Basophils Relative: 0 %
Eosinophils Absolute: 0 10*3/uL (ref 0.0–0.5)
Eosinophils Relative: 0 %
HCT: 34.5 % — ABNORMAL LOW (ref 36.0–46.0)
Hemoglobin: 11.4 g/dL — ABNORMAL LOW (ref 12.0–15.0)
Immature Granulocytes: 1 %
Lymphocytes Relative: 12 %
Lymphs Abs: 1.2 10*3/uL (ref 0.7–4.0)
MCH: 32.2 pg (ref 26.0–34.0)
MCHC: 33 g/dL (ref 30.0–36.0)
MCV: 97.5 fL (ref 80.0–100.0)
Monocytes Absolute: 0.5 10*3/uL (ref 0.1–1.0)
Monocytes Relative: 5 %
Neutro Abs: 8.2 10*3/uL — ABNORMAL HIGH (ref 1.7–7.7)
Neutrophils Relative %: 82 %
Platelets: 323 10*3/uL (ref 150–400)
RBC: 3.54 MIL/uL — ABNORMAL LOW (ref 3.87–5.11)
RDW: 13.8 % (ref 11.5–15.5)
WBC: 9.9 10*3/uL (ref 4.0–10.5)
nRBC: 0 % (ref 0.0–0.2)

## 2018-10-04 LAB — GLUCOSE, CAPILLARY
Glucose-Capillary: 264 mg/dL — ABNORMAL HIGH (ref 70–99)
Glucose-Capillary: 299 mg/dL — ABNORMAL HIGH (ref 70–99)
Glucose-Capillary: 242 mg/dL — ABNORMAL HIGH (ref 70–99)

## 2018-10-04 LAB — PROCALCITONIN: Procalcitonin: 1.91 ng/mL

## 2018-10-04 LAB — COMPREHENSIVE METABOLIC PANEL
ALT: 37 U/L (ref 0–44)
AST: 22 U/L (ref 15–41)
Albumin: 3 g/dL — ABNORMAL LOW (ref 3.5–5.0)
Alkaline Phosphatase: 53 U/L (ref 38–126)
Anion gap: 11 (ref 5–15)
BUN: 31 mg/dL — ABNORMAL HIGH (ref 6–20)
CO2: 22 mmol/L (ref 22–32)
Calcium: 10.9 mg/dL — ABNORMAL HIGH (ref 8.9–10.3)
Chloride: 109 mmol/L (ref 98–111)
Creatinine, Ser: 0.77 mg/dL (ref 0.44–1.00)
GFR calc Af Amer: >60 mL/min (ref >60)
GFR calc non Af Amer: >60 mL/min (ref >60)
Glucose, Bld: 287 mg/dL — ABNORMAL HIGH (ref 70–99)
Potassium: 3.5 mmol/L (ref 3.5–5.1)
Sodium: 142 mmol/L (ref 135–145)
Total Bilirubin: 0.6 mg/dL (ref 0.3–1.2)
Total Protein: 6.7 g/dL (ref 6.5–8.1)

## 2018-10-04 LAB — MAGNESIUM: Magnesium: 1.8 mg/dL (ref 1.7–2.4)

## 2018-10-04 LAB — FERRITIN: Ferritin: 1045 ng/mL — ABNORMAL HIGH (ref 11–307)

## 2018-10-04 LAB — D-DIMER, QUANTITATIVE: D-Dimer, Quant: 1.98 ug/mL-FEU — ABNORMAL HIGH (ref 0.00–0.50)

## 2018-10-04 LAB — C-REACTIVE PROTEIN: CRP: 5.4 mg/dL — ABNORMAL HIGH (ref <1.0)

## 2018-10-04 MED ORDER — MAGNESIUM SULFATE 2 GM/50ML IV SOLN
2.0000 g | Freq: Once | INTRAVENOUS | Status: AC
  Administered 2018-10-04: 2 g via INTRAVENOUS
  Filled 2018-10-04: qty 50

## 2018-10-04 MED ORDER — MORPHINE SULFATE ER 30 MG PO TBCR
30.0000 mg | EXTENDED_RELEASE_TABLET | Freq: Two times a day (BID) | ORAL | Status: DC
  Administered 2018-10-04 – 2018-10-07 (×7): 30 mg via ORAL
  Filled 2018-10-04 (×7): qty 1

## 2018-10-04 MED ORDER — POTASSIUM CHLORIDE 10 MEQ/100ML IV SOLN
10.0000 meq | INTRAVENOUS | Status: AC
  Administered 2018-10-04 (×3): 10 meq via INTRAVENOUS
  Filled 2018-10-04: qty 100

## 2018-10-04 MED ORDER — INSULIN GLARGINE 100 UNIT/ML ~~LOC~~ SOLN
14.0000 [IU] | Freq: Two times a day (BID) | SUBCUTANEOUS | Status: DC
  Administered 2018-10-04 – 2018-10-08 (×9): 14 [IU] via SUBCUTANEOUS
  Filled 2018-10-04 (×10): qty 0.14

## 2018-10-04 NOTE — Progress Notes (Addendum)
1925 Report received and care assumed from [facility]. Pt resting in chair with call bell in reach. NADN No c/o needs met Will continue to monitor.  2145 Pt HS medications given. Pt resting in chair with call bell in reach.NADN Needs met Will continue to monitor  0530 Pt sitting in chair A.M. medications given NADN Call bell in reach. Needs met. Will continue to monitor

## 2018-10-04 NOTE — Progress Notes (Signed)
[facility] TEAM 1 - Stepdown/ICU TEAM  Gina Hill  [id number] DOB: 1960-02-26 DOA: 10/01/2018 PCP: Dion Body, MD    Brief NarrativeZY:[id number] with a history of DM 2, HTN, chronic pain syndrome, and morbid obesity who was brought by EMS to the ED with 24 hours of altered mentation accompanied by fevers up to 104.  The patient had reported cough and upper respiratory symptoms for 2 to 3 days before the onset of the fever.  In the ED the patient had a temperature of 102.8 and was hypoxic at 82% on room air.  CXR noted bilateral multifocal infiltrates and a SARS-CoV-2 test was positive.  Significant Events: 9/9 approximate onset of symptoms 9/12 admit to [facility] via [facility]  COVID-19 specific Treatment: Decadron 9/12 > Remdesivir 9/12 > 9/16  Subjective: Remains on room air but saturations have been trending down since admission, beginning at 98% presently 90%.  CXR yesterday however noted decreased pulmonary infiltrates.  Inflammatory markers are all trending downward.  The patient is alert today but confused.  She behaves like someone who is overmedicated.  She perseverates on certain questions and is not able to answer simple things like "where do you live."  There are no focal neurologic deficits.  She moves all 4 extremities equally and facies are symmetrical.  Assessment & Plan:  COVID pneumonia - acute hypoxic respiratory failure -possible bacterial pneumonia Has been weaned to room air - with markedly elevated procalcitonin will continue empiric antibiotics for a 7-day course- continue steroid and Remdesivir -appears to be slowly improving -suspect some dips in oxygen status may be due to narcotics -adjusting narcotic dose and following  Recent Labs  Lab 10/01/18 1034 10/01/18 1158 10/02/18 0535 10/03/18 0304 10/04/18 0240  DDIMER  --  1.99* >20.00* 5.24* 1.98*  FERRITIN  --  >1,500* 1,290* 2,140* 1,045*  CRP  --  17.9* 20.0* 11.1* 5.4*  ALT 31  --  50* 46* 37   PROCALCITON  --   --  6.18  --  1.91    COVID gastroenteritis - transaminitis Symptomatic management - LFTs have normalized -today the patient appears to be improving from this standpoint -follow clinically  Recent Labs  Lab 10/01/18 1034 10/02/18 0535 10/03/18 0304 10/04/18 0240  AST 45* 74* 35 22  ALT 31 50* 46* 37  ALKPHOS 51 53 53 53  BILITOT 0.6 0.7 0.9 0.6  PROT 7.1 7.4 7.4 6.7  ALBUMIN 3.1* 3.0* 3.1* 3.0*    Acute metabolic encephalopathy The patient's mental status waxes and wanes but today she appears to be possibly somewhat overdosed with narcotic -I have chosen to stop her narcotic patch -I have confirmed in care everywhere that she is on her prescribed dose of Butrans but currently this appears to be sedating her significantly -I will begin a modest dose of MS Contin to help control pain and hopefully prevent narcotic withdrawal -she will be monitored closely   DM 2 - uncontrolled with hyperglycemia CBG significantly elevated -adjust treatment again and follow  Mild hypokalemia Continue to supplement to goal of 4.0  Mild hypomagnesemia Magnesium not yet at goal -supplement further and follow until 2.0  HTN Blood pressure now better controlled - follow trend  Chronic pain syndrome /depression Depression appears to be playing a role in her current illness -continue home medical therapy except as noted above in regard to narcotic pain medication  Obesity - Estimated body mass index is 35.24 kg/m as calculated from the following:  Height as of this encounter: 5\' 7"  (1.702 m).   Weight as of this encounter: 102.1 kg.   DVT prophylaxis: Lovenox Code Status: FULL CODE Family Communication:  Disposition Plan: MedSurg bed- begin PT/OT - mobilize   Consultants:  none  Antimicrobials:  Cefepime 9/12 > Vancomycin 9/12 Flagyl 9/12  Objective: Blood pressure (!) 122/59, pulse 74, temperature 98.2 F (36.8 C), resp. rate 19, height 5\' 7"  (1.702 m), weight  102.1 kg, SpO2 92 %.  Intake/Output Summary (Last 24 hours) at 10/04/2018 0806 Last data filed at 10/04/2018 0416 Gross per 24 hour  Intake 350 ml  Output 250 ml  Net 100 ml   Filed Weights   10/01/18 0955 10/01/18 1216  Weight: 107.5 kg 102.1 kg    Examination: General: No acute respiratory distress -sedate -confused Lungs: Clear to auscultation throughout without wheezing Cardiovascular: RRR -no murmur Abdomen: Mildly tender diffusely, soft, no rebound Extremities: No significant edema B LE   CBC: Recent Labs  Lab 10/02/18 0535 10/03/18 0304 10/04/18 0240  WBC 8.3 9.4 9.9  NEUTROABS 7.1 7.8* 8.2*  HGB 11.4* 11.6* 11.4*  HCT 35.0* 35.0* 34.5*  MCV 98.6 97.2 97.5  PLT 262 305 [id number]   Basic Metabolic Panel: Recent Labs  Lab 10/02/18 0535 10/03/18 0304 10/04/18 0240  NA 142 142 142  K 3.9 3.3* 3.5  CL 111 109 109  CO2 22 22 22   GLUCOSE 221* 309* 287*  BUN 15 23* 31*  CREATININE 0.69 0.73 0.77  CALCIUM 10.4* 10.6* 10.9*  MG  --   --  1.8   GFR: Estimated Creatinine Clearance: 96.4 mL/min (by C-G formula based on SCr of 0.77 mg/dL).  Liver Function Tests: Recent Labs  Lab 10/01/18 1034 10/02/18 0535 10/03/18 0304 10/04/18 0240  AST 45* 74* 35 22  ALT 31 50* 46* 37  ALKPHOS 51 53 53 53  BILITOT 0.6 0.7 0.9 0.6  PROT 7.1 7.4 7.4 6.7  ALBUMIN 3.1* 3.0* 3.1* 3.0*    Coagulation Profile: Recent Labs  Lab 10/01/18 1034  INR 1.2    HbA1C: Hgb A1c MFr Bld  Date/Time Value Ref Range Status  05/29/2009 06:40 AM (H) <5.7 % Final   7.4 (NOTE)                                                                       According to the ADA Clinical Practice Recommendations for 2011, when HbA1c is used as a screening test:   >=6.5%   Diagnostic of Diabetes Mellitus           (if abnormal result  is confirmed)  5.7-6.4%   Increased risk of developing Diabetes Mellitus  References:Diagnosis and Classification of Diabetes Mellitus,Diabetes [id number]  1):S62-S69 and Standards of Medical Care in         Diabetes - 2011,Diabetes Care,2011,34  (Suppl 1):S11-S61.    CBG: Recent Labs  Lab 10/02/18 2019 10/03/18 0822 10/03/18 1253 10/03/18 1704 10/03/18 2116  GLUCAP 260* 346* 268* 241* 239*    Recent Results (from the past 240 hour(s))  SARS Coronavirus 2 [facility] order, Performed in [facility] hospital lab) Nasopharyngeal Nasopharyngeal Swab     Status: Abnormal   Collection Time: 10/01/18  9:50 AM   Specimen: Nasopharyngeal  Swab  Result Value Ref Range Status   SARS Coronavirus 2 POSITIVE (A) NEGATIVE Final    Comment: RESULT CALLED TO, READ BACK BY AND VERIFIED WITH: MCMANUS @ [id number] ON [id number] BY HENDERSON L. (NOTE) If result is NEGATIVE SARS-CoV-2 target nucleic acids are NOT DETECTED. The SARS-CoV-2 RNA is generally detectable in upper and lower  respiratory specimens during the acute phase of infection. The lowest  concentration of SARS-CoV-2 viral copies this assay can detect is 250  copies / mL. A negative result does not preclude SARS-CoV-2 infection  and should not be used as the sole basis for treatment or other  patient management decisions.  A negative result may occur with  improper specimen collection / handling, submission of specimen other  than nasopharyngeal swab, presence of viral mutation(s) within the  areas targeted by this assay, and inadequate number of viral copies  (<250 copies / mL). A negative result must be combined with clinical  observations, patient history, and epidemiological information. If result is POSITIVE SARS-CoV-2 target nucleic acids are DETEC TED. The SARS-CoV-2 RNA is generally detectable in upper and lower  respiratory specimens during the acute phase of infection.  Positive  results are indicative of active infection with SARS-CoV-2.  Clinical  correlation with patient history and other diagnostic information is  necessary to determine patient infection status.  Positive  results do  not rule out bacterial infection or co-infection with other viruses. If result is PRESUMPTIVE POSTIVE SARS-CoV-2 nucleic acids MAY BE PRESENT.   A presumptive positive result was obtained on the submitted specimen  and confirmed on repeat testing.  While 2019 novel coronavirus  (SARS-CoV-2) nucleic acids may be present in the submitted sample  additional confirmatory testing may be necessary for epidemiological  and / or clinical management purposes  to differentiate between  SARS-CoV-2 and other Sarbecovirus currently known to infect humans.  If clinically indicated additional testing with an alternate test  methodology ([id number] [id number]) is advised. The SARS-CoV-2 RNA is generally  detectable in upper and lower respiratory specimens during the acute  phase of infection. The expected result is Negative. Fact Sheet for Patients:  StrictlyIdeas.no Fact Sheet for Healthcare Providers: BankingDealers.co.za This test is not yet approved or cleared by the [place] FDA and has been authorized for detection and/or diagnosis of SARS-CoV-2 by FDA under an Emergency Use Authorization (EUA).  This EUA will remain in effect (meaning this test can be used) for the duration of the COVID-19 declaration under Section 564(b)(1) of the Act, 21 U.S.C. section 360bbb-3(b)(1), unless the authorization is terminated or revoked sooner. Performed at [facility], [address], [place], [place] [address]   Urine culture     Status: None   Collection Time: 10/01/18  9:59 AM   Specimen: Urine, Catheterized  Result Value Ref Range Status   Specimen Description   Final    URINE, CATHETERIZED Performed at [facility], [address] [place], [place] [address]    Special Requests   Final    NONE Performed at [facility], [address] [place], [place] [address]    Culture   Final    NO GROWTH Performed at [facility], [place] [address]  [place], [place] [address]    Report Status 10/03/2018 FINAL  Final  Blood Culture (routine x 2)     Status: None (Preliminary result)   Collection Time: 10/01/18 10:36 AM   Specimen: BLOOD RIGHT HAND  Result Value Ref Range Status  Specimen Description   Final    BLOOD RIGHT HAND BOTTLES DRAWN AEROBIC AND ANAEROBIC   Special Requests Blood Culture adequate volume  Final   Culture   Final    NO GROWTH 3 DAYS Performed at [facility], [address] [place], [place] [address]    Report Status PENDING  Incomplete  Blood Culture (routine x 2)     Status: None (Preliminary result)   Collection Time: 10/01/18 10:36 AM   Specimen: BLOOD LEFT HAND  Result Value Ref Range Status   Specimen Description   Final    BLOOD LEFT HAND BOTTLES DRAWN AEROBIC AND ANAEROBIC   Special Requests Blood Culture adequate volume  Final   Culture   Final    NO GROWTH 3 DAYS Performed at [facility], [address], [place], [place] [address]    Report Status PENDING  Incomplete     Scheduled Meds: . amLODipine  10 mg Oral Daily  . buprenorphine  1 patch Transdermal Weekly  . dexamethasone (DECADRON) injection  6 mg Intravenous Q24H  . enoxaparin (LOVENOX) injection  50 mg Subcutaneous Q12H  . famotidine  20 mg Oral Daily  . folic acid  1 mg Oral Daily  . insulin aspart  0-20 Units Subcutaneous TID WC  . insulin glargine  18 Units Subcutaneous Daily  . ipratropium  2 puff Inhalation Q6H  . metoprolol succinate  50 mg Oral BID  . multivitamin with minerals  1 tablet Oral Daily  . oxybutynin  10 mg Oral Daily  . pregabalin  150 mg Oral QID  . sodium chloride flush  3 mL Intravenous Q12H  . thiamine  100 mg Oral Daily  . topiramate  100 mg Oral BID  . venlafaxine XR  37.5 mg Oral QHS  . vitamin C  500 mg Oral Daily  . zinc sulfate  220 mg Oral Daily   Continuous Infusions: . sodium chloride    . ceFEPime (MAXIPIME) IV 2 g (10/04/18 0014)  . remdesivir 100 mg in NS 250 mL Stopped (10/04/18 0416)      LOS: 3 days   Cherene Altes, MD [facility] Office  [phone] Pager - Text Page per Amion  If 7PM-7AM, please contact night-coverage per Amion 10/04/2018, 8:06 AM

## 2018-10-04 NOTE — Progress Notes (Signed)
Occupational Therapy Treatment Patient Details Name: Gina Hill MRN: [id number] DOB: 11/26/1960 Today's Date: 10/04/2018    History of present illness 58 yo female presenting to ED with AMS and fever. Tested COVID positive. PMH including DM, depression, chronic pain syndrome, breast cancer, HTN, morbid obesity, and prior back sx (per pt report).    OT comments  On entry to room, pt had apparently pulled her IV out - nsg notified. Pt with apparent cognitive deficits. Pt incontinent of urine and aware she was sitting in soiled chair but had not asked for assistance to clean up. Pt perseverating on chocolate cookies and required Mod vc to follow through on task completion. Impulsive and high fall risk during this session. Pt states that her husband now has Covid and she does not know who will be there to help at DC. PTA, pt was driving and managing her own medication, which is concerning giving current cognition. Will further assess cognition. Pt will need 24/7 S and HHOT and [place]. MD and CM notified. Will continue ot follow acutely.   Follow Up Recommendations  Home health OT;Supervision/Assistance - 24 hour    Equipment Recommendations  3 in 1 bedside commode    Recommendations for Other Services PT consult    Precautions / Restrictions Precautions Precautions: Fall       Mobility Bed Mobility                  Transfers Overall transfer level: Needs assistance Equipment used: Rolling walker (2 wheeled) Transfers: Sit to/from Stand Sit to Stand: Min guard              Balance Overall balance assessment: Needs assistance   Sitting balance-Leahy Scale: Good       Standing balance-Leahy Scale: Fair                             ADL either performed or assessed with clinical judgement   ADL Overall ADL's : Needs assistance/impaired     Grooming: Supervision/safety;Set up;Sitting   Upper Body Bathing: Set up;Supervision/ safety;Sitting   Lower  Body Bathing: Minimal assistance;Sit to/from stand Lower Body Bathing Details (indicate cue type and reason): unsafe with bathing; giving pt VC to sit and bath lower legs. Pt continued to bend over toward floor, almost losing balance posteirorly then unable to remember if she had washed her bottom     Lower Body Dressing: Minimal assistance;Sit to/from stand   Toilet Transfer: RW;Ambulation;BSC;Min guard     Toileting - Clothing Manipulation Details (indicate cue type and reason): [place]     Functional mobility during ADLs: Min guard;Rolling walker       Vision       Perception     Praxis      Cognition Arousal/Alertness: Awake/alert Behavior During Therapy: Flat affect;Impulsive Overall Cognitive Status: Impaired/Different from baseline Area of Impairment: Orientation;Attention;Memory;Following commands;Safety/judgement;Awareness;Problem solving                 Orientation Level: Disoriented to;Time Current Attention Level: Sustained Memory: Decreased recall of precautions;Decreased short-term memory Following Commands: Follows one step commands consistently Safety/Judgement: Decreased awareness of safety;Decreased awareness of deficits Awareness: Emergent Problem Solving: Slow processing;Decreased initiation General Comments: Slow processing; perseverating on chocolate chip cookies; Pt would not answer the question about the mohnth and would only resons "chocolate chip cookie". Pt had pulled her IV out. Incontinenet in chair of urine. Slow to process infomraiton; unsafe; asked pt to  sit down to bath lower legs, however pt continued to stand bedning over to floor and almost losing her balance backwards        Exercises     Shoulder Instructions       General Comments      Pertinent Vitals/ Pain       Pain Assessment: No/denies pain  Home Living Family/patient expects to be discharged to:: Private residence Living Arrangements: Spouse/significant  other;Children Available Help at Discharge: Family;Personal care attendant Type of Home: House Home Access: Stairs to enter Technical brewer of Steps: 2 Entrance Stairs-Rails: None Home Layout: One level     Bathroom Shower/Tub: Tub/shower unit         Home Equipment: Environmental consultant - 2 wheels          Prior Functioning/Environment Level of Independence: Independent with assistive device(s)        Comments: Pt reporting she uses RW at home. She also reports she performs BADLs and PCA performs IADLs and driving. Daughter (facetiming patient during session) reports that pt will stay in bed during the day and her blood sugar with drop; she will get OOB to eat dinner with her husband when he returned home in the evenings.   Frequency  Min 3X/week        Progress Toward Goals  OT Goals(current goals can now be found in the care plan section)     Acute Rehab OT Goals Patient Stated Goal: "Go home" OT Goal Formulation: Patient unable to participate in goal setting Time For Goal Achievement: 10/16/18 Potential to Achieve Goals: Good  Plan      Co-evaluation                 AM-PAC OT "6 Clicks" Daily Activity     Outcome Measure   Help from another person eating meals?: None Help from another person taking care of personal grooming?: A Little Help from another person toileting, which includes using toliet, bedpan, or urinal?: A Little Help from another person bathing (including washing, rinsing, drying)?: A Little Help from another person to put on and taking off regular upper body clothing?: A Little Help from another person to put on and taking off regular lower body clothing?: A Little 6 Click Score: 19    End of Session    OT Visit Diagnosis: Unsteadiness on feet (R26.81);Other abnormalities of gait and mobility (R26.89);Muscle weakness (generalized) (M62.81);Other symptoms and signs involving cognitive function;Pain Pain - part of body: (general  discomfort; chronic pain)   Activity Tolerance Patient tolerated treatment well   Patient Left in chair;with call bell/phone within reach;with chair alarm set   Nurse Communication Mobility status        Time: 1600-1630 OT Time Calculation (min): 30 min  Charges: OT General Charges $OT Visit: 1 Visit OT Treatments $Self Care/Home Management : 23-37 mins  Maurie Boettcher, OT/L   Acute OT Clinical Specialist [place] Pager [phone] Office [phone]    [facility] 10/04/2018, 5:31 PM

## 2018-10-04 NOTE — Plan of Care (Signed)
Patient up to chair. All medication given well tolerated. Called daughter with update. Will continue to monitor for remainder of shift.    Problem: Education: Goal: Knowledge of risk factors and measures for prevention of condition will improve 10/04/2018 1159 by Orvan Falconer, RN Outcome: Progressing 10/04/2018 1159 by Orvan Falconer, RN Outcome: Progressing 10/04/2018 1159 by Orvan Falconer, RN Outcome: Progressing   Problem: Coping: Goal: Psychosocial and spiritual needs will be supported 10/04/2018 1159 by Orvan Falconer, RN Outcome: Progressing 10/04/2018 1159 by Orvan Falconer, RN Outcome: Progressing 10/04/2018 1159 by Orvan Falconer, RN Outcome: Progressing   Problem: Respiratory: Goal: Will maintain a patent airway 10/04/2018 1159 by Orvan Falconer, RN Outcome: Progressing 10/04/2018 1159 by Orvan Falconer, RN Outcome: Progressing 10/04/2018 1159 by Orvan Falconer, RN Outcome: Progressing Goal: Complications related to the disease process, condition or treatment will be avoided or minimized 10/04/2018 1159 by Orvan Falconer, RN Outcome: Progressing 10/04/2018 1159 by Orvan Falconer, RN Outcome: Progressing 10/04/2018 1159 by Orvan Falconer, RN Outcome: Progressing   Problem: Education: Goal: Knowledge of General Education information will improve Description: Including pain rating scale, medication(s)/side effects and non-pharmacologic comfort measures 10/04/2018 1159 by Orvan Falconer, RN Outcome: Progressing 10/04/2018 1159 by Orvan Falconer, RN Outcome: Progressing 10/04/2018 1159 by Orvan Falconer, RN Outcome: Progressing   Problem: Health Behavior/Discharge Planning: Goal: Ability to manage health-related needs will improve 10/04/2018 1159 by Orvan Falconer, RN Outcome: Progressing 10/04/2018 1159 by Orvan Falconer, RN Outcome: Progressing 10/04/2018 1159 by Orvan Falconer, RN Outcome:  Progressing   Problem: Clinical Measurements: Goal: Ability to maintain clinical measurements within normal limits will improve 10/04/2018 1159 by Orvan Falconer, RN Outcome: Progressing 10/04/2018 1159 by Orvan Falconer, RN Outcome: Progressing 10/04/2018 1159 by Orvan Falconer, RN Outcome: Progressing Goal: Will remain free from infection 10/04/2018 1159 by Orvan Falconer, RN Outcome: Progressing 10/04/2018 1159 by Orvan Falconer, RN Outcome: Progressing 10/04/2018 1159 by Orvan Falconer, RN Outcome: Progressing Goal: Diagnostic test results will improve 10/04/2018 1159 by Orvan Falconer, RN Outcome: Progressing 10/04/2018 1159 by Orvan Falconer, RN Outcome: Progressing 10/04/2018 1159 by Orvan Falconer, RN Outcome: Progressing Goal: Respiratory complications will improve 10/04/2018 1159 by Orvan Falconer, RN Outcome: Progressing 10/04/2018 1159 by Orvan Falconer, RN Outcome: Progressing 10/04/2018 1159 by Orvan Falconer, RN Outcome: Progressing Goal: Cardiovascular complication will be avoided 10/04/2018 1159 by Orvan Falconer, RN Outcome: Progressing 10/04/2018 1159 by Orvan Falconer, RN Outcome: Progressing 10/04/2018 1159 by Orvan Falconer, RN Outcome: Progressing   Problem: Activity: Goal: Risk for activity intolerance will decrease 10/04/2018 1159 by Orvan Falconer, RN Outcome: Progressing 10/04/2018 1159 by Orvan Falconer, RN Outcome: Progressing 10/04/2018 1159 by Orvan Falconer, RN Outcome: Progressing   Problem: Nutrition: Goal: Adequate nutrition will be maintained 10/04/2018 1159 by Orvan Falconer, RN Outcome: Progressing 10/04/2018 1159 by Orvan Falconer, RN Outcome: Progressing 10/04/2018 1159 by Orvan Falconer, RN Outcome: Progressing   Problem: Coping: Goal: Level of anxiety will decrease 10/04/2018 1159 by Orvan Falconer, RN Outcome: Progressing 10/04/2018 1159 by Orvan Falconer, RN Outcome: Progressing 10/04/2018 1159 by Orvan Falconer, RN Outcome: Progressing   Problem: Elimination: Goal: Will not experience complications related to bowel motility 10/04/2018 1159 by Orvan Falconer, RN Outcome: Progressing 10/04/2018 1159 by Orvan Falconer, RN Outcome: Progressing 10/04/2018 1159 by Orvan Falconer,  RN Outcome: Progressing Goal: Will not experience complications related to urinary retention 10/04/2018 1159 by Orvan Falconer, RN Outcome: Progressing 10/04/2018 1159 by Orvan Falconer, RN Outcome: Progressing 10/04/2018 1159 by Orvan Falconer, RN Outcome: Progressing   Problem: Pain Managment: Goal: General experience of comfort will improve 10/04/2018 1159 by Orvan Falconer, RN Outcome: Progressing 10/04/2018 1159 by Orvan Falconer, RN Outcome: Progressing 10/04/2018 1159 by Orvan Falconer, RN Outcome: Progressing   Problem: Safety: Goal: Ability to remain free from injury will improve 10/04/2018 1159 by Orvan Falconer, RN Outcome: Progressing 10/04/2018 1159 by Orvan Falconer, RN Outcome: Progressing 10/04/2018 1159 by Orvan Falconer, RN Outcome: Progressing   Problem: Skin Integrity: Goal: Risk for impaired skin integrity will decrease 10/04/2018 1159 by Orvan Falconer, RN Outcome: Progressing 10/04/2018 1159 by Orvan Falconer, RN Outcome: Progressing 10/04/2018 1159 by Orvan Falconer, RN Outcome: Progressing

## 2018-10-05 LAB — COMPREHENSIVE METABOLIC PANEL
ALT: 38 U/L (ref 0–44)
AST: 20 U/L (ref 15–41)
Albumin: 3 g/dL — ABNORMAL LOW (ref 3.5–5.0)
Alkaline Phosphatase: 52 U/L (ref 38–126)
Anion gap: 8 (ref 5–15)
BUN: 30 mg/dL — ABNORMAL HIGH (ref 6–20)
CO2: 24 mmol/L (ref 22–32)
Calcium: 10.9 mg/dL — ABNORMAL HIGH (ref 8.9–10.3)
Chloride: 111 mmol/L (ref 98–111)
Creatinine, Ser: 0.75 mg/dL (ref 0.44–1.00)
GFR calc Af Amer: >60 mL/min (ref >60)
GFR calc non Af Amer: >60 mL/min (ref >60)
Glucose, Bld: 203 mg/dL — ABNORMAL HIGH (ref 70–99)
Potassium: 3.6 mmol/L (ref 3.5–5.1)
Sodium: 143 mmol/L (ref 135–145)
Total Bilirubin: 0.7 mg/dL (ref 0.3–1.2)
Total Protein: 6.7 g/dL (ref 6.5–8.1)

## 2018-10-05 LAB — GLUCOSE, CAPILLARY
Glucose-Capillary: 278 mg/dL — ABNORMAL HIGH (ref 70–99)
Glucose-Capillary: 142 mg/dL — ABNORMAL HIGH (ref 70–99)
Glucose-Capillary: 226 mg/dL — ABNORMAL HIGH (ref 70–99)
Glucose-Capillary: 228 mg/dL — ABNORMAL HIGH (ref 70–99)
Glucose-Capillary: 260 mg/dL — ABNORMAL HIGH (ref 70–99)

## 2018-10-05 LAB — CBC WITH DIFFERENTIAL/PLATELET
Abs Immature Granulocytes: 0.05 10*3/uL (ref 0.00–0.07)
Basophils Absolute: 0 10*3/uL (ref 0.0–0.1)
Basophils Relative: 0 %
Eosinophils Absolute: 0 10*3/uL (ref 0.0–0.5)
Eosinophils Relative: 0 %
HCT: 35.5 % — ABNORMAL LOW (ref 36.0–46.0)
Hemoglobin: 11.7 g/dL — ABNORMAL LOW (ref 12.0–15.0)
Immature Granulocytes: 1 %
Lymphocytes Relative: 22 %
Lymphs Abs: 1.9 10*3/uL (ref 0.7–4.0)
MCH: 32.6 pg (ref 26.0–34.0)
MCHC: 33 g/dL (ref 30.0–36.0)
MCV: 98.9 fL (ref 80.0–100.0)
Monocytes Absolute: 0.7 10*3/uL (ref 0.1–1.0)
Monocytes Relative: 8 %
Neutro Abs: 6 10*3/uL (ref 1.7–7.7)
Neutrophils Relative %: 69 %
Platelets: 331 10*3/uL (ref 150–400)
RBC: 3.59 MIL/uL — ABNORMAL LOW (ref 3.87–5.11)
RDW: 13.8 % (ref 11.5–15.5)
WBC: 8.6 10*3/uL (ref 4.0–10.5)
nRBC: 0 % (ref 0.0–0.2)

## 2018-10-05 LAB — PROCALCITONIN: Procalcitonin: 0.8 ng/mL

## 2018-10-05 LAB — AMMONIA: Ammonia: 30 umol/L (ref 9–35)

## 2018-10-05 LAB — VITAMIN B12: Vitamin B-12: 4955 pg/mL — ABNORMAL HIGH (ref 180–914)

## 2018-10-05 LAB — FOLATE: Folate: 29 ng/mL (ref >5.9)

## 2018-10-05 LAB — C-REACTIVE PROTEIN: CRP: 2.6 mg/dL — ABNORMAL HIGH (ref <1.0)

## 2018-10-05 LAB — FERRITIN: Ferritin: 943 ng/mL — ABNORMAL HIGH (ref 11–307)

## 2018-10-05 LAB — D-DIMER, QUANTITATIVE: D-Dimer, Quant: 1.09 ug/mL-FEU — ABNORMAL HIGH (ref 0.00–0.50)

## 2018-10-05 MED ORDER — TOPIRAMATE 25 MG PO TABS
50.0000 mg | ORAL_TABLET | Freq: Two times a day (BID) | ORAL | Status: DC
  Administered 2018-10-05 – 2018-10-06 (×3): 50 mg via ORAL
  Filled 2018-10-05 (×4): qty 2

## 2018-10-05 MED ORDER — INSULIN ASPART 100 UNIT/ML ~~LOC~~ SOLN
3.0000 [IU] | Freq: Three times a day (TID) | SUBCUTANEOUS | Status: DC
  Administered 2018-10-05 – 2018-10-09 (×10): 3 [IU] via SUBCUTANEOUS

## 2018-10-05 NOTE — Progress Notes (Signed)
Educated pt to use incentive, she mastered 750 x2. Noted some delay in responses

## 2018-10-05 NOTE — Progress Notes (Signed)
Physical Therapy Treatment Patient Details Name: Gina Hill MRN: [id number] DOB: [birth year] Today's Date: 10/05/2018    History of Present Illness 58 yo female presenting to ED with AMS and fever. Tested COVID positive. PMH including DM, depression, chronic pain syndrome, breast cancer, HTN, morbid obesity, and prior back sx (per pt report).     PT Comments    The patient continues to demonstrate AMS, perseverates  On "my 3 sisters said I dropped my 3 children off". Patient ambulated x  200' with RW with multimodal cues for direction  To ambulate. Aftwer returning to room, patient assisted to [facility], patient then began to have jerking movements of  Arms and legs, a droppng like/buckling  Of knees, sort of jerking, once to point pt. Sat down to [facility].  Patient continues to not be able to fully express self, perseverates and does not finish thought with words.  Follow Up Recommendations  Home health PT     Equipment Recommendations  None recommended by PT    Recommendations for Other Services       Precautions / Restrictions Precautions Precautions: Fall Precaution Comments: demonstrates 'DROPSY" OF ARMS AND LEGS AFTER AMBULATING. PT. REPORTS " I AM HAVING THE SHAKES"    Mobility  Bed Mobility               General bed mobility comments: in recliner  Transfers Overall transfer level: Needs assistance Equipment used: Rolling walker (2 wheeled) Transfers: Sit to/from Stand Sit to Stand: Min assist Stand pivot transfers: Min assist       General transfer comment: performed x 2 from recliner, x 2 from [facility] with legs jerking and buckling and sat back down to [facility]. recliner brought up after noted knees buckling and  jerking, noted affect change also  Ambulation/Gait Ambulation/Gait assistance: Min assist Gait Distance (Feet): 200 Feet Assistive device: Rolling walker (2 wheeled) Gait Pattern/deviations: Step-through pattern Gait velocity: decr   General Gait Details: gait  steady during entire walk, was not until return to rooom and sat on BSC did legs jerk   Stairs             Wheelchair Mobility    Modified Rankin (Stroke Patients Only)       Balance Overall balance assessment: Needs assistance Sitting-balance support: No upper extremity supported;Feet supported Sitting balance-Leahy Scale: Good     Standing balance support: Single extremity supported;During functional activity Standing balance-Leahy Scale: Poor Standing balance comment: cues to hold 1 side of RW while performing pericare, legs buckling, sat to [facility]                            Cognition Arousal/Alertness: Awake/alert Behavior During Therapy: Flat affect Overall Cognitive Status: Impaired/Different from baseline Area of Impairment: Orientation;Attention;Memory;Following commands;Safety/judgement;Awareness;Problem solving                 Orientation Level: Disoriented to;Time Current Attention Level: Sustained Memory: Decreased recall of precautions;Decreased short-term memory Following Commands: Follows one step commands consistently Safety/Judgement: Decreased awareness of safety;Decreased awareness of deficits Awareness: Emergent Problem Solving: Slow processing;Decreased initiation General Comments: slow processing but improved from previous visit, initially. Patient perseverating on " My 3 sisters said I dropped my 3 children off "> stated repeatedly, also indicated that'something not right at home" My husband has illness, how will tat work. required multimodal cues for safety and to carry through with tasks      Exercises  General Comments        Pertinent Vitals/Pain Pain Assessment: No/denies pain    Home Living                      Prior Function            PT Goals (current goals can now be found in the care plan section) Progress towards PT goals: Progressing toward goals    Frequency    Min 3X/week      PT  Plan Current plan remains appropriate;Discharge plan needs to be updated    Co-evaluation              AM-PAC PT "6 Clicks" Mobility   Outcome Measure  Help needed turning from your back to your side while in a flat bed without using bedrails?: A Little Help needed moving from lying on your back to sitting on the side of a flat bed without using bedrails?: A Little Help needed moving to and from a bed to a chair (including a wheelchair)?: A Little Help needed standing up from a chair using your arms (e.g., wheelchair or bedside chair)?: A Little Help needed to walk in hospital room?: A Lot Help needed climbing 3-5 steps with a railing? : A Lot 6 Click Score: 16    End of Session Equipment Utilized During Treatment: Gait belt Activity Tolerance: Patient tolerated treatment well Patient left: in chair;with call bell/phone within reach;with chair alarm set Nurse Communication: Mobility status PT Visit Diagnosis: Unsteadiness on feet (R26.81);Muscle weakness (generalized) (M62.81);Other symptoms and signs involving the nervous system (R29.898);Difficulty in walking, not elsewhere classified (R26.2)     Time: 1100-1140 PT Time Calculation (min) (ACUTE ONLY): 40 min  Charges:  $Gait Training: 23-37 mins $Self Care/Home Management: [place] Pager [phone] Office [phone]    Claretha Cooper 10/05/2018, 3:28 PM

## 2018-10-05 NOTE — TOC Initial Note (Signed)
Transition of Care [facility]) - Initial/Assessment Note    Patient Details  Name: Gina Hill MRN: [id number] Date of Birth: [birth year]  Transition of Care [facility]) CM/SW Contact:    Ninfa Meeker, RN Phone Number:  [phone] (working remotely) 10/05/2018, 4:33 PM  Clinical Narrative:   . Case manager contacted patient via telephone with bedside RN assisting. Patient was not comprehending conversation. Case manager called patient's daughter, Sheran Fava [phone] to discuss discharge needs. Choice for Home Health agency was offered. She states that her mom has private pay CNA's from 8a-4p and from 8:30p -12MN. Her husband works Health and safety inspector. Referral called to Adela Lank, [facility] Liaison. [name] states she and her siblings have concerns about mom's neurological state and will follow up with her primary.   Expected Discharge Plan: [place]     Patient Goals and CMS Choice Patient states their goals for this hospitalization and ongoing recovery are:: daughter wants mom to get better CMS Medicare.gov Compare Post Acute Care list provided to:: Patient Represenative (must comment)(daughter) Choice offered to / list presented to : Adult Children  Expected Discharge Plan and Services Expected Discharge Plan: [place]   Discharge Planning Services: CM Consult Post Acute Care Choice: [place] arrangements for the past 2 months: [place] Expected Discharge Date: 10/08/18               DME Arranged: N/A         HH Arranged: PT, OT, Social Work [place] Agency: [facility] Date [place] Agency Contacted: 10/05/18 Time [place]: 1632 Representative spoke with at [place]: Adela Lank  Prior Living Arrangements/Services Living arrangements for the past 2 months: [place] with:: Spouse Patient language and need for interpreter reviewed:: Yes Do you feel safe going back to the place where you  live?: Yes      Need for Family Participation in Patient Care: Yes (Comment) Care giver support system in place?: Yes (comment)   Criminal Activity/Legal Involvement Pertinent to Current Situation/Hospitalization: No - Comment as needed  Activities of Daily Living Home Assistive Devices/Equipment: CBG Meter, Wheelchair, Environmental consultant (specify type), Cane (specify quad or straight) ADL Screening (condition at time of admission) Patient's cognitive ability adequate to safely complete daily activities?: Yes Is the patient deaf or have difficulty hearing?: No Does the patient have difficulty seeing, even when wearing glasses/contacts?: Yes Does the patient have difficulty concentrating, remembering, or making decisions?: No Patient able to express need for assistance with ADLs?: Yes Does the patient have difficulty dressing or bathing?: No Independently performs ADLs?: Yes (appropriate for developmental age) Does the patient have difficulty walking or climbing stairs?: No Weakness of Legs: Both Weakness of Arms/Hands: Both  Permission Sought/Granted Permission sought to share information with : Case Manager Permission granted to share information with : Yes, Verbal Permission Granted     Permission granted to share info w AGENCY: [id number]        Emotional Assessment       Orientation: : Fluctuating Orientation (Suspected and/or reported Sundowners) Alcohol / Substance Use: Not Applicable Psych Involvement: No (comment)  Admission diagnosis:  Hypoxia [R09.02] COVID-19 virus infection [U07.1] Patient Active Problem List   Diagnosis Date Noted  . Acute respiratory disease due to COVID-19 virus 10/01/2018  . Acute Hypoxic Respiratory failure, acute--- due to COVID-19 10/01/2018  . Pneumonia due to COVID-19 virus 10/01/2018  . Breast cancer of upper-outer quadrant of right female breast ([place])  11/05/2015  . Breast mass, right 05/08/2013  . Hypertension 02/26/2012  . Diabetes ([place])  02/26/2012  . Chronic pelvic pain in female 02/26/2012  . Obese   . Endometriosis    PCP:  Dion Body, MD Pharmacy:   [place], [address] [place] [address] Phone: [phone] Fax: [phone]  [facility] [phone] - [place], [place] - [address] [place] [address] Phone: [phone] Fax: [phone]     Social Determinants of Health (SDOH) Interventions    Readmission Risk Interventions No flowsheet data found.

## 2018-10-05 NOTE — Progress Notes (Signed)
PROGRESS NOTE  Gina Hill  [id number] DOB: 07/31/1960 DOA: 10/01/2018 PCP: Dion Body, MD   Brief Narrative: Gina Hill is a 58 y.o. female with a history of T2DM, morbid obesity, HTN, and chronic pain syndrome on chronic opioid medication who was brought to the ED 9/12 by EMS for fevers up to 104F and altered mental status in the setting of URI symptoms and cough for 2-3 days. In the ED the patient was febrile to 102.34F and hypoxic to 82% on room air with bilateral infiltrates on CXR and positive SARS-CoV-2 PCR test.     Assessment & Plan: Principal Problem:   Acute Hypoxic Respiratory failure, acute--- due to COVID-19 Active Problems:   Hypertension   Diabetes ([place])   Acute respiratory disease due to COVID-19 virus   Pneumonia due to COVID-19 virus   Acute hypoxic respiratory failure due to covid-19 pneumonia and possible CAP: Hypoxia improving. Onset of illness on 9/9 put her at risk of decompensation around this time.  - Complete 5 days of remdesivir tonight (9/12 - 9/16) - Continue IV antibiotics given persistently elevated procalcitonin. - Continue steroids - Continue airborne, contact precautions. PPE including surgical gown, gloves, cap, shoe covers, and CAPR used during this encounter in a negative pressure room.  - Check daily labs: CBC w/diff, CMP, d-dimer, ferritin, CRP. Inflammatory markers with downward trend (CRP 20 >> 2.6).  - Enoxaparin prophylactic dose. - Maintain euvolemia/net negative.  - Avoid NSAIDs - Recommend proning and aggressive use of incentive spirometry.  Acute metabolic encephalopathy: Likely related to polypharmacy, possibly psychogenic component as well with restricted affect. Nonfocal exam. - Decrease sedating medications as below - Delirium precautions.  Chronic pain syndrome, depression:  - Buprenorphine 59mcg/hr patch stopped, MS contin replacing this, will decrease dose as there are no signs of withdrawal.  - Continue lyrica  and effexor. Also on topiramate which is known to cause confusion, will decrease dose 100mg  BID > 50mg  BID.  Elevated d-dimer: Initially >20, rapidly decreased without therapuetic anticoagulation and no longer hypoxic without symptoms of DVT.  - Continue prophylactic lovenox, no further workup currently planned.   T2DM with hyperglycemia: Steroid-induced worsening noted.  - Continue SSI and lantus, add low dose mealtime insulin given persistent postprandial elevation. - Holding glipizide  Covid-19 gastroenteritis: Improving clinically - Continue supportive care  LFT elevation: Improved. Likely due to viral infection.   Morbid obesity: BMI 35.  - Weight loss recommended long term  Hypokalemia:  - Supplement as needed  Hypomagnesemia:  - Supplement as needed to maintain goal of 2.   HTN:  - Continue metoprolol - Monitor and adjust regimen as necessary.   Normocytic anemia: Possibly due to acute illness  - Monitor for bleeding.  DVT prophylaxis: Lovenox Code Status: Full Family Communication: None at bedside Disposition Plan: Continue remdesivir and discharge once mentation is clearing.   Consultants:   PT/OT  Procedures:   None  Antimicrobials:  Remdesivir 9/12 - 9/16  Vancomycin 9/12  Flagyl 9/12  Cefepime 9/12 >>  Subjective: Denies shortness of breath or pain anywhere. Knows she's in the former [facility] for covid, but unable to answer further orientation questions. Repeats that she's in the old [facility] when asked what year or month it is.  Objective: Vitals:   10/04/18 1613 10/04/18 2130 10/05/18 0800 10/05/18 0819  BP: 128/74 121/67 121/71 (!) 136/96  Pulse: 67  (!) 51 60  Resp: 20 16 16    Temp: 99.1 F (37.3 C) 98.3 F (36.8 C)  98.7 F (37.1 C)   TempSrc: Oral  Oral   SpO2: 92%     Weight:      Height:        Intake/Output Summary (Last 24 hours) at 10/05/2018 1202 Last data filed at 10/05/2018 0800 Gross per 24 hour  Intake  600 ml  Output -  Net 600 ml   Filed Weights   10/01/18 0955 10/01/18 1216  Weight: 107.5 kg 102.1 kg    Gen: 58 y.o. female in no distress Pulm: Non-labored breathing room air. Clear to auscultation bilaterally.  CV: Regular rate and rhythm. No murmur, rub, or gallop. No JVD, no pedal edema. GI: Abdomen soft, non-tender, non-distended, with normoactive bowel sounds. No organomegaly or masses felt. Ext: Warm, no deformities Skin: No rashes, lesions or ulcers Neuro: Alert, oriented to person and place. No focal neurological deficits. Psych: Judgement and insight appear impaired by clouded sensorium. Mood & affect appropriate.   Data Reviewed: I have personally reviewed following labs and imaging studies  CBC: Recent Labs  Lab 10/01/18 1034 10/02/18 0535 10/03/18 0304 10/04/18 0240 10/05/18 0320  WBC 8.9 8.3 9.4 9.9 8.6  NEUTROABS 7.0 7.1 7.8* 8.2* 6.0  HGB 10.5* 11.4* 11.6* 11.4* 11.7*  HCT 32.1* 35.0* 35.0* 34.5* 35.5*  MCV 98.5 98.6 97.2 97.5 98.9  PLT 234 262 305 323 [id number]   Basic Metabolic Panel: Recent Labs  Lab 10/01/18 1034 10/02/18 0535 10/03/18 0304 10/04/18 0240 10/05/18 0320  NA 141 142 142 142 143  K 3.1* 3.9 3.3* 3.5 3.6  CL 113* 111 109 109 111  CO2 20* 22 22 22 24   GLUCOSE 142* 221* 309* 287* 203*  BUN 20 15 23* 31* 30*  CREATININE 0.96 0.69 0.73 0.77 0.75  CALCIUM 10.1 10.4* 10.6* 10.9* 10.9*  MG  --   --   --  1.8  --    GFR: Estimated Creatinine Clearance: 96.4 mL/min (by C-G formula based on SCr of 0.75 mg/dL). Liver Function Tests: Recent Labs  Lab 10/01/18 1034 10/02/18 0535 10/03/18 0304 10/04/18 0240 10/05/18 0320  AST 45* 74* 35 22 20  ALT 31 50* 46* 37 38  ALKPHOS 51 53 53 53 52  BILITOT 0.6 0.7 0.9 0.6 0.7  PROT 7.1 7.4 7.4 6.7 6.7  ALBUMIN 3.1* 3.0* 3.1* 3.0* 3.0*   No results for input(s): LIPASE, AMYLASE in the last 168 hours. Recent Labs  Lab 10/05/18 0320  AMMONIA 30   Coagulation Profile: Recent Labs  Lab  10/01/18 1034  INR 1.2   Cardiac Enzymes: No results for input(s): CKTOTAL, CKMB, CKMBINDEX, TROPONINI in the last 168 hours. BNP (last 3 results) No results for input(s): PROBNP in the last 8760 hours. HbA1C: No results for input(s): HGBA1C in the last 72 hours. CBG: Recent Labs  Lab 10/04/18 0819 10/04/18 1211 10/04/18 1614 10/05/18 0729 10/05/18 1146  GLUCAP 264* 299* 242* 142* 226*   Lipid Profile: No results for input(s): CHOL, HDL, LDLCALC, TRIG, CHOLHDL, LDLDIRECT in the last 72 hours. Thyroid Function Tests: No results for input(s): TSH, T4TOTAL, FREET4, T3FREE, THYROIDAB in the last 72 hours. Anemia Panel: Recent Labs    10/04/18 0240 10/05/18 0320  VITAMINB12  --  4,955*  FOLATE  --  29.0  FERRITIN 1,045* 943*   Urine analysis:    Component Value Date/Time   COLORURINE YELLOW 10/01/2018 0959   APPEARANCEUR HAZY (A) 10/01/2018 0959   LABSPEC 1.035 (H) 10/01/2018 0959   PHURINE 5.0 10/01/2018 0959   GLUCOSEU  NEGATIVE 10/01/2018 0959   HGBUR SMALL (A) 10/01/2018 0959   BILIRUBINUR NEGATIVE 10/01/2018 0959   KETONESUR 5 (A) 10/01/2018 0959   PROTEINUR >=300 (A) 10/01/2018 0959   UROBILINOGEN 1.0 05/28/2009 2049   NITRITE NEGATIVE 10/01/2018 0959   LEUKOCYTESUR NEGATIVE 10/01/2018 0959   Recent Results (from the past 240 hour(s))  SARS Coronavirus 2 [facility] order, Performed in [facility] hospital lab) Nasopharyngeal Nasopharyngeal Swab     Status: Abnormal   Collection Time: 10/01/18  9:50 AM   Specimen: Nasopharyngeal Swab  Result Value Ref Range Status   SARS Coronavirus 2 POSITIVE (A) NEGATIVE Final    Comment: RESULT CALLED TO, READ BACK BY AND VERIFIED WITH: MCMANUS @ 1256 ON [id number] BY HENDERSON L. (NOTE) If result is NEGATIVE SARS-CoV-2 target nucleic acids are NOT DETECTED. The SARS-CoV-2 RNA is generally detectable in upper and lower  respiratory specimens during the acute phase of infection. The lowest  concentration of SARS-CoV-2 viral  copies this assay can detect is 250  copies / mL. A negative result does not preclude SARS-CoV-2 infection  and should not be used as the sole basis for treatment or other  patient management decisions.  A negative result may occur with  improper specimen collection / handling, submission of specimen other  than nasopharyngeal swab, presence of viral mutation(s) within the  areas targeted by this assay, and inadequate number of viral copies  (<250 copies / mL). A negative result must be combined with clinical  observations, patient history, and epidemiological information. If result is POSITIVE SARS-CoV-2 target nucleic acids are DETEC TED. The SARS-CoV-2 RNA is generally detectable in upper and lower  respiratory specimens during the acute phase of infection.  Positive  results are indicative of active infection with SARS-CoV-2.  Clinical  correlation with patient history and other diagnostic information is  necessary to determine patient infection status.  Positive results do  not rule out bacterial infection or co-infection with other viruses. If result is PRESUMPTIVE POSTIVE SARS-CoV-2 nucleic acids MAY BE PRESENT.   A presumptive positive result was obtained on the submitted specimen  and confirmed on repeat testing.  While 2019 novel coronavirus  (SARS-CoV-2) nucleic acids may be present in the submitted sample  additional confirmatory testing may be necessary for epidemiological  and / or clinical management purposes  to differentiate between  SARS-CoV-2 and other Sarbecovirus currently known to infect humans.  If clinically indicated additional testing with an alternate test  methodology (LAB7 [id number]) is advised. The SARS-CoV-2 RNA is generally  detectable in upper and lower respiratory specimens during the acute  phase of infection. The expected result is Negative. Fact Sheet for Patients:  StrictlyIdeas.no Fact Sheet for Healthcare Providers:  BankingDealers.co.za This test is not yet approved or cleared by the [place] FDA and has been authorized for detection and/or diagnosis of SARS-CoV-2 by FDA under an Emergency Use Authorization (EUA).  This EUA will remain in effect (meaning this test can be used) for the duration of the COVID-19 declaration under Section 564(b)(1) of the Act, 21 U.S.C. section 360bbb-3(b)(1), unless the authorization is terminated or revoked sooner. Performed at [facility], [address], [place], [place] [address]   Urine culture     Status: None   Collection Time: 10/01/18  9:59 AM   Specimen: Urine, Catheterized  Result Value Ref Range Status   Specimen Description   Final    URINE, CATHETERIZED Performed at [facility], [address], [place], [place] [address]  Special Requests   Final    NONE Performed at [facility], [address] [place], [place] [address]    Culture   Final    NO GROWTH Performed at [facility], [place] [address], [place], [place] [address]    Report Status 10/03/2018 FINAL  Final  Blood Culture (routine x 2)     Status: None (Preliminary result)   Collection Time: 10/01/18 10:36 AM   Specimen: BLOOD RIGHT HAND  Result Value Ref Range Status   Specimen Description   Final    BLOOD RIGHT HAND BOTTLES DRAWN AEROBIC AND ANAEROBIC   Special Requests Blood Culture adequate volume  Final   Culture   Final    NO GROWTH 4 DAYS Performed at [facility], [address] [place], [place] [address]    Report Status PENDING  Incomplete  Blood Culture (routine x 2)     Status: None (Preliminary result)   Collection Time: 10/01/18 10:36 AM   Specimen: BLOOD LEFT HAND  Result Value Ref Range Status   Specimen Description   Final    BLOOD LEFT HAND BOTTLES DRAWN AEROBIC AND ANAEROBIC   Special Requests Blood Culture adequate volume  Final   Culture   Final    NO GROWTH 4 DAYS Performed at [facility], [address], [place], [place]  [address]    Report Status PENDING  Incomplete      Radiology Studies: No results found.  Scheduled Meds: . amLODipine  10 mg Oral Daily  . dexamethasone (DECADRON) injection  6 mg Intravenous Q24H  . enoxaparin (LOVENOX) injection  50 mg Subcutaneous Q12H  . famotidine  20 mg Oral Daily  . folic acid  1 mg Oral Daily  . insulin aspart  0-20 Units Subcutaneous TID WC  . insulin glargine  14 Units Subcutaneous BID  . ipratropium  2 puff Inhalation Q6H  . metoprolol succinate  50 mg Oral BID  . morphine  30 mg Oral Q12H  . multivitamin with minerals  1 tablet Oral Daily  . oxybutynin  10 mg Oral Daily  . pregabalin  150 mg Oral QID  . sodium chloride flush  3 mL Intravenous Q12H  . thiamine  100 mg Oral Daily  . topiramate  100 mg Oral BID  . venlafaxine XR  37.5 mg Oral QHS  . vitamin C  500 mg Oral Daily  . zinc sulfate  220 mg Oral Daily   Continuous Infusions: . sodium chloride    . ceFEPime (MAXIPIME) IV 2 g (10/05/18 0836)  . remdesivir 100 mg in NS 250 mL Stopped (10/04/18 2241)     LOS: 4 days   Time spent: 25 minutes.  Patrecia Pour, MD [facility] [email or web address] Password [facility] 10/05/2018, 12:02 PM

## 2018-10-05 NOTE — Progress Notes (Addendum)
1905 Report received and care assumed from [place]. Pt resting in chair with call bell in reach. NADN No c/o needs met. Will continue to monitor.  0500 Pt had incontinent episode. Full linen change and gown change with bath. Will continue to monitor. Call bell in reach. Wil continue to monitor

## 2018-10-05 NOTE — Plan of Care (Addendum)
Spoke to daughter with update. Will continue to monitor for remainder of shift.   Problem: Education: Goal: Knowledge of risk factors and measures for prevention of condition will improve 10/05/2018 0844 by Orvan Falconer, RN Outcome: Progressing 10/05/2018 0843 by Orvan Falconer, RN Outcome: Progressing   Problem: Coping: Goal: Psychosocial and spiritual needs will be supported 10/05/2018 0844 by Orvan Falconer, RN Outcome: Progressing 10/05/2018 0843 by Orvan Falconer, RN Outcome: Progressing   Problem: Respiratory: Goal: Will maintain a patent airway 10/05/2018 0844 by Orvan Falconer, RN Outcome: Progressing 10/05/2018 0843 by Orvan Falconer, RN Outcome: Progressing Goal: Complications related to the disease process, condition or treatment will be avoided or minimized 10/05/2018 0844 by Orvan Falconer, RN Outcome: Progressing 10/05/2018 0843 by Orvan Falconer, RN Outcome: Progressing   Problem: Education: Goal: Knowledge of General Education information will improve Description: Including pain rating scale, medication(s)/side effects and non-pharmacologic comfort measures 10/05/2018 0844 by Orvan Falconer, RN Outcome: Progressing 10/05/2018 0843 by Orvan Falconer, RN Outcome: Progressing   Problem: Health Behavior/Discharge Planning: Goal: Ability to manage health-related needs will improve 10/05/2018 0844 by Orvan Falconer, RN Outcome: Progressing 10/05/2018 0843 by Orvan Falconer, RN Outcome: Progressing   Problem: Clinical Measurements: Goal: Ability to maintain clinical measurements within normal limits will improve 10/05/2018 0844 by Orvan Falconer, RN Outcome: Progressing 10/05/2018 0843 by Orvan Falconer, RN Outcome: Progressing Goal: Will remain free from infection 10/05/2018 0844 by Orvan Falconer, RN Outcome: Progressing 10/05/2018 0843 by Orvan Falconer, RN Outcome: Progressing Goal: Diagnostic test  results will improve 10/05/2018 0844 by Orvan Falconer, RN Outcome: Progressing 10/05/2018 0843 by Orvan Falconer, RN Outcome: Progressing Goal: Respiratory complications will improve 10/05/2018 0844 by Orvan Falconer, RN Outcome: Progressing 10/05/2018 0843 by Orvan Falconer, RN Outcome: Progressing Goal: Cardiovascular complication will be avoided 10/05/2018 0844 by Orvan Falconer, RN Outcome: Progressing 10/05/2018 0843 by Orvan Falconer, RN Outcome: Progressing   Problem: Activity: Goal: Risk for activity intolerance will decrease 10/05/2018 0844 by Orvan Falconer, RN Outcome: Progressing 10/05/2018 0843 by Orvan Falconer, RN Outcome: Progressing   Problem: Nutrition: Goal: Adequate nutrition will be maintained 10/05/2018 0844 by Orvan Falconer, RN Outcome: Progressing 10/05/2018 0843 by Orvan Falconer, RN Outcome: Progressing   Problem: Coping: Goal: Level of anxiety will decrease 10/05/2018 0844 by Orvan Falconer, RN Outcome: Progressing 10/05/2018 0843 by Orvan Falconer, RN Outcome: Progressing   Problem: Elimination: Goal: Will not experience complications related to bowel motility 10/05/2018 0844 by Orvan Falconer, RN Outcome: Progressing 10/05/2018 0843 by Orvan Falconer, RN Outcome: Progressing Goal: Will not experience complications related to urinary retention 10/05/2018 0844 by Orvan Falconer, RN Outcome: Progressing 10/05/2018 0843 by Orvan Falconer, RN Outcome: Progressing   Problem: Pain Managment: Goal: General experience of comfort will improve 10/05/2018 0844 by Orvan Falconer, RN Outcome: Progressing 10/05/2018 0843 by Orvan Falconer, RN Outcome: Progressing   Problem: Safety: Goal: Ability to remain free from injury will improve 10/05/2018 0844 by Orvan Falconer, RN Outcome: Progressing 10/05/2018 0843 by Orvan Falconer, RN Outcome: Progressing   Problem: Skin  Integrity: Goal: Risk for impaired skin integrity will decrease 10/05/2018 0844 by Orvan Falconer, RN Outcome: Progressing 10/05/2018 0843 by Orvan Falconer, RN Outcome: Progressing

## 2018-10-06 ENCOUNTER — Inpatient Hospital Stay (HOSPITAL_COMMUNITY): Payer: Medicare Other

## 2018-10-06 LAB — CBC WITH DIFFERENTIAL/PLATELET
Abs Immature Granulocytes: 0.1 10*3/uL — ABNORMAL HIGH (ref 0.00–0.07)
Basophils Absolute: 0 10*3/uL (ref 0.0–0.1)
Basophils Relative: 0 %
Eosinophils Absolute: 0 10*3/uL (ref 0.0–0.5)
Eosinophils Relative: 0 %
HCT: 37.9 % (ref 36.0–46.0)
Hemoglobin: 12.3 g/dL (ref 12.0–15.0)
Immature Granulocytes: 1 %
Lymphocytes Relative: 24 %
Lymphs Abs: 2.1 10*3/uL (ref 0.7–4.0)
MCH: 32.1 pg (ref 26.0–34.0)
MCHC: 32.5 g/dL (ref 30.0–36.0)
MCV: 99 fL (ref 80.0–100.0)
Monocytes Absolute: 0.7 10*3/uL (ref 0.1–1.0)
Monocytes Relative: 8 %
Neutro Abs: 5.9 10*3/uL (ref 1.7–7.7)
Neutrophils Relative %: 67 %
Platelets: 340 10*3/uL (ref 150–400)
RBC: 3.83 MIL/uL — ABNORMAL LOW (ref 3.87–5.11)
RDW: 13.4 % (ref 11.5–15.5)
WBC: 8.8 10*3/uL (ref 4.0–10.5)
nRBC: 0 % (ref 0.0–0.2)

## 2018-10-06 LAB — COMPREHENSIVE METABOLIC PANEL
ALT: 46 U/L — ABNORMAL HIGH (ref 0–44)
AST: 31 U/L (ref 15–41)
Albumin: 3.2 g/dL — ABNORMAL LOW (ref 3.5–5.0)
Alkaline Phosphatase: 54 U/L (ref 38–126)
Anion gap: 11 (ref 5–15)
BUN: 24 mg/dL — ABNORMAL HIGH (ref 6–20)
CO2: 24 mmol/L (ref 22–32)
Calcium: 11.1 mg/dL — ABNORMAL HIGH (ref 8.9–10.3)
Chloride: 108 mmol/L (ref 98–111)
Creatinine, Ser: 0.67 mg/dL (ref 0.44–1.00)
GFR calc Af Amer: >60 mL/min (ref >60)
GFR calc non Af Amer: >60 mL/min (ref >60)
Glucose, Bld: 111 mg/dL — ABNORMAL HIGH (ref 70–99)
Potassium: 3.2 mmol/L — ABNORMAL LOW (ref 3.5–5.1)
Sodium: 143 mmol/L (ref 135–145)
Total Bilirubin: 0.5 mg/dL (ref 0.3–1.2)
Total Protein: 7 g/dL (ref 6.5–8.1)

## 2018-10-06 LAB — CULTURE, BLOOD (ROUTINE X 2)
Culture: NO GROWTH
Culture: NO GROWTH
Special Requests: ADEQUATE
Special Requests: ADEQUATE

## 2018-10-06 LAB — GLUCOSE, CAPILLARY
Glucose-Capillary: 125 mg/dL — ABNORMAL HIGH (ref 70–99)
Glucose-Capillary: 113 mg/dL — ABNORMAL HIGH (ref 70–99)
Glucose-Capillary: 258 mg/dL — ABNORMAL HIGH (ref 70–99)
Glucose-Capillary: 468 mg/dL — ABNORMAL HIGH (ref 70–99)

## 2018-10-06 LAB — FERRITIN: Ferritin: 820 ng/mL — ABNORMAL HIGH (ref 11–307)

## 2018-10-06 LAB — HEMOGLOBIN A1C
Hgb A1c MFr Bld: 6.6 % — ABNORMAL HIGH (ref 4.8–5.6)
Mean Plasma Glucose: 142.72 mg/dL

## 2018-10-06 LAB — TSH: TSH: 0.458 u[IU]/mL (ref 0.350–4.500)

## 2018-10-06 LAB — C-REACTIVE PROTEIN: CRP: 1.6 mg/dL — ABNORMAL HIGH (ref <1.0)

## 2018-10-06 LAB — D-DIMER, QUANTITATIVE: D-Dimer, Quant: 0.88 ug/mL-FEU — ABNORMAL HIGH (ref 0.00–0.50)

## 2018-10-06 LAB — RPR: RPR Ser Ql: NONREACTIVE

## 2018-10-06 MED ORDER — POTASSIUM CHLORIDE CRYS ER 20 MEQ PO TBCR
40.0000 meq | EXTENDED_RELEASE_TABLET | Freq: Once | ORAL | Status: AC
  Administered 2018-10-06: 40 meq via ORAL
  Filled 2018-10-06: qty 2

## 2018-10-06 MED ORDER — INSULIN ASPART 100 UNIT/ML ~~LOC~~ SOLN
10.0000 [IU] | Freq: Once | SUBCUTANEOUS | Status: AC
  Administered 2018-10-06: 22:00:00 10 [IU] via SUBCUTANEOUS

## 2018-10-06 NOTE — Progress Notes (Signed)
PROGRESS NOTE  Gina Hill  [id number] DOB: 08/15/1960 DOA: 10/01/2018 PCP: Dion Body, MD, PCP in [place] is with [facility].   Brief Narrative: Gina Hill is a 58 y.o. female with a history of T2DM, morbid obesity, HTN, and chronic pain syndrome on chronic opioid medication who was brought to the ED 9/12 by EMS for fevers up to 104F and altered mental status in the setting of URI symptoms and cough for 2-3 days. In the ED the patient was febrile to 102.100F and hypoxic to 82% on room air with bilateral infiltrates on CXR and positive SARS-CoV-2 PCR test.     Assessment & Plan: Principal Problem:   Acute Hypoxic Respiratory failure, acute--- due to COVID-19 Active Problems:   Hypertension   Diabetes ([place])   Acute respiratory disease due to COVID-19 virus   Pneumonia due to COVID-19 virus   Acute hypoxic respiratory failure due to covid-19 pneumonia and possible CAP: Hypoxia improving. Onset of illness on 9/9 put her at risk of decompensation around this time.  - Completed 5 days of remdesivir (9/12 - 9/16) - Completed broad antibiotics x5 days (9/12 - 9/16) - Continue steroids x 10 days. - Continue airborne, contact precautions. PPE including surgical gown, gloves, cap, shoe covers, and CAPR used during this encounter in a negative pressure room.  - Check daily labs: CBC w/diff, CMP, d-dimer, ferritin, CRP. Inflammatory markers with downward trend (CRP 20 >> 1.6).  - Enoxaparin prophylactic dose. - Maintain euvolemia/net negative.  - Avoid NSAIDs - Recommend proning and aggressive use of incentive spirometry.  Acute metabolic encephalopathy: Presentation is most consistent with global insult. With no documented hypotension nor profound hypoxemia, most likely related to prolonged hypoglycemia prior to admission. Polypharmacy, possibly psychogenic component with restricted affect also likely contributing. Nonfocal exam. - Avoid hypoglycemia - TSH is wnl. RPR pending.  Check CT head. Only prior available is May 2011 showing microvascular changes prominent in periventricular white matter without discrete infarct.  - Decreased sedating medications as below - Delirium precautions.  Chronic pain syndrome, lumbar stenosis with sciatica, depression:  - Buprenorphine 31mcg/hr patch stopped, MS contin replacing this, will decrease dose as there are no signs of withdrawal. Under the care of Dr. Kathi Der as outpatient, has gotten injections. Had RFA performed earlier this year and has reported decline in functional status since that time with planned repeat procedure later this year. - Continue lyrica and effexor. Also on topiramate which is known to cause confusion, will decrease dose 100mg  BID > 50mg  BID.  Elevated d-dimer: Initially >20, rapidly decreased without therapuetic anticoagulation and no longer hypoxic without symptoms of DVT.  - Continue prophylactic lovenox, no further workup currently planned.   T2DM with hyperglycemia: Steroid-induced worsening noted. Prolonged hypoglycemia reported PTA by family concomitant with persistent mental status changes.  - Continue SSI and lantus, add low dose mealtime insulin given persistent postprandial elevation. - Holding home medications. HbA1c 6.6% indicates likely too tight of control in frail patient.   Covid-19 gastroenteritis: Improving clinically - Continue supportive care  LFT elevation: Improved. Likely due to viral infection.   Morbid obesity: BMI 35.  - Weight loss recommended long term  Hypokalemia:  - Supplement again today.   Hypomagnesemia:  - Supplement as needed to maintain goal of 2.   HTN:  - Continue metoprolol - Monitor and adjust regimen as necessary.   Normocytic anemia: Possibly due to acute illness  - Monitor for bleeding.  DVT prophylaxis: Lovenox 0.5mg /kg q24h = 50mg  Code Status:  Full Family Communication: None at bedside. Discussing with daughter daily. Disposition Plan:  Continue remdesivir and discharge once mentation is clearing and/or work up is completed. CM consulted to assist with home health needs, complicated long term home healthcare due to pt now having covid-19 infection.   Consultants:   PT/OT  Procedures:   None  Antimicrobials:  Remdesivir 9/12 - 9/16  Vancomycin 9/12  Flagyl 9/12  Cefepime 9/12 - 9/16  Subjective: Has no complaints today, continues to repeat similar phrases. Today it was "I thought about you today." Her daughter reports that this change from her previous mentation occurred in the setting of protracted hypoglycemia over several days beginning last week which were followed by hyperglycemia.   Objective: Vitals:   10/06/18 0739 10/06/18 0740 10/06/18 0814 10/06/18 1016  BP:   (!) 144/76 113/67  Pulse: (!) 57 (!) 55 68 60  Resp:   20   Temp:   97.8 F (36.6 C)   TempSrc:   Oral   SpO2: 96% 94% 95%   Weight:      Height:        Intake/Output Summary (Last 24 hours) at 10/06/2018 1107 Last data filed at 10/06/2018 1020 Gross per 24 hour  Intake 656 ml  Output 800 ml  Net -144 ml   Filed Weights   10/01/18 0955 10/01/18 1216  Weight: 107.5 kg 102.1 kg   Gen: 58 y.o. female in no distress Pulm: Nonlabored breathing room air. Clear. CV: Regular rate and rhythm. No murmur, rub, or gallop. No JVD, no dependent edema. GI: Abdomen soft, non-tender, non-distended, with normoactive bowel sounds.  Ext: Warm, no deformities Skin: No new rashes, lesions or ulcers on visualized skin. Neuro: Alert, interactive, partially oriented. No sensorimotor deficit x4 extremities, has difficulty understanding commands, but does follow most of them. No dysphasia or aphasia, facial droop, or obvious visual field cut.  Psych: Judgement and insight appear limited by limited attention, decreased global recall. Mood euthymic & affect congruent. Behavior is appropriate.    Data Reviewed: I have personally reviewed following labs and  imaging studies  CBC: Recent Labs  Lab 10/02/18 0535 10/03/18 0304 10/04/18 0240 10/05/18 0320 10/06/18 0230  WBC 8.3 9.4 9.9 8.6 8.8  NEUTROABS 7.1 7.8* 8.2* 6.0 5.9  HGB 11.4* 11.6* 11.4* 11.7* 12.3  HCT 35.0* 35.0* 34.5* 35.5* 37.9  MCV 98.6 97.2 97.5 98.9 99.0  PLT 262 305 323 331 [id number]   Basic Metabolic Panel: Recent Labs  Lab 10/02/18 0535 10/03/18 0304 10/04/18 0240 10/05/18 0320 10/06/18 0230  NA 142 142 142 143 143  K 3.9 3.3* 3.5 3.6 3.2*  CL 111 109 109 111 108  CO2 22 22 22 24 24   GLUCOSE 221* 309* 287* 203* 111*  BUN 15 23* 31* 30* 24*  CREATININE 0.69 0.73 0.77 0.75 0.67  CALCIUM 10.4* 10.6* 10.9* 10.9* 11.1*  MG  --   --  1.8  --   --    GFR: Estimated Creatinine Clearance: 96.4 mL/min (by C-G formula based on SCr of 0.67 mg/dL). Liver Function Tests: Recent Labs  Lab 10/02/18 0535 10/03/18 0304 10/04/18 0240 10/05/18 0320 10/06/18 0230  AST 74* 35 22 20 31   ALT 50* 46* 37 38 46*  ALKPHOS 53 53 53 52 54  BILITOT 0.7 0.9 0.6 0.7 0.5  PROT 7.4 7.4 6.7 6.7 7.0  ALBUMIN 3.0* 3.1* 3.0* 3.0* 3.2*   No results for input(s): LIPASE, AMYLASE in the last 168 hours. Recent Labs  Lab 10/05/18 0320  AMMONIA 30   Coagulation Profile: Recent Labs  Lab 10/01/18 1034  INR 1.2   Cardiac Enzymes: No results for input(s): CKTOTAL, CKMB, CKMBINDEX, TROPONINI in the last 168 hours. BNP (last 3 results) No results for input(s): PROBNP in the last 8760 hours. HbA1C: Recent Labs    10/06/18 0230  HGBA1C 6.6*   CBG: Recent Labs  Lab 10/05/18 0729 10/05/18 1146 10/05/18 1644 10/05/18 1948 10/06/18 0811  GLUCAP 142* 226* 228* 260* 125*   Lipid Profile: No results for input(s): CHOL, HDL, LDLCALC, TRIG, CHOLHDL, LDLDIRECT in the last 72 hours. Thyroid Function Tests: Recent Labs    10/06/18 0230  TSH 0.458   Anemia Panel: Recent Labs    10/05/18 0320 10/06/18 0230  VITAMINB12 4,955*  --   FOLATE 29.0  --   FERRITIN 943* 820*   Urine  analysis:    Component Value Date/Time   COLORURINE YELLOW 10/01/2018 0959   APPEARANCEUR HAZY (A) 10/01/2018 0959   LABSPEC 1.035 (H) 10/01/2018 0959   PHURINE 5.0 10/01/2018 0959   GLUCOSEU NEGATIVE 10/01/2018 0959   HGBUR SMALL (A) 10/01/2018 0959   BILIRUBINUR NEGATIVE 10/01/2018 0959   KETONESUR 5 (A) 10/01/2018 0959   PROTEINUR >=300 (A) 10/01/2018 0959   UROBILINOGEN 1.0 05/28/2009 2049   NITRITE NEGATIVE 10/01/2018 0959   LEUKOCYTESUR NEGATIVE 10/01/2018 0959   Recent Results (from the past 240 hour(s))  SARS Coronavirus 2 [facility] order, Performed in [facility] hospital lab) Nasopharyngeal Nasopharyngeal Swab     Status: Abnormal   Collection Time: 10/01/18  9:50 AM   Specimen: Nasopharyngeal Swab  Result Value Ref Range Status   SARS Coronavirus 2 POSITIVE (A) NEGATIVE Final    Comment: RESULT CALLED TO, READ BACK BY AND VERIFIED WITH: MCMANUS @ 1256 ON [id number] BY HENDERSON L. (NOTE) If result is NEGATIVE SARS-CoV-2 target nucleic acids are NOT DETECTED. The SARS-CoV-2 RNA is generally detectable in upper and lower  respiratory specimens during the acute phase of infection. The lowest  concentration of SARS-CoV-2 viral copies this assay can detect is 250  copies / mL. A negative result does not preclude SARS-CoV-2 infection  and should not be used as the sole basis for treatment or other  patient management decisions.  A negative result may occur with  improper specimen collection / handling, submission of specimen other  than nasopharyngeal swab, presence of viral mutation(s) within the  areas targeted by this assay, and inadequate number of viral copies  (<250 copies / mL). A negative result must be combined with clinical  observations, patient history, and epidemiological information. If result is POSITIVE SARS-CoV-2 target nucleic acids are DETEC TED. The SARS-CoV-2 RNA is generally detectable in upper and lower  respiratory specimens during the acute phase  of infection.  Positive  results are indicative of active infection with SARS-CoV-2.  Clinical  correlation with patient history and other diagnostic information is  necessary to determine patient infection status.  Positive results do  not rule out bacterial infection or co-infection with other viruses. If result is PRESUMPTIVE POSTIVE SARS-CoV-2 nucleic acids MAY BE PRESENT.   A presumptive positive result was obtained on the submitted specimen  and confirmed on repeat testing.  While 2019 novel coronavirus  (SARS-CoV-2) nucleic acids may be present in the submitted sample  additional confirmatory testing may be necessary for epidemiological  and / or clinical management purposes  to differentiate between  SARS-CoV-2 and other Sarbecovirus currently known to infect humans.  If  clinically indicated additional testing with an alternate test  methodology ([id number] [id number]) is advised. The SARS-CoV-2 RNA is generally  detectable in upper and lower respiratory specimens during the acute  phase of infection. The expected result is Negative. Fact Sheet for Patients:  StrictlyIdeas.no Fact Sheet for Healthcare Providers: BankingDealers.co.za This test is not yet approved or cleared by the [place] FDA and has been authorized for detection and/or diagnosis of SARS-CoV-2 by FDA under an Emergency Use Authorization (EUA).  This EUA will remain in effect (meaning this test can be used) for the duration of the COVID-19 declaration under Section 564(b)(1) of the Act, 21 U.S.C. section 360bbb-3(b)(1), unless the authorization is terminated or revoked sooner. Performed at [facility], [address] [place], [place] [address]   Urine culture     Status: None   Collection Time: 10/01/18  9:59 AM   Specimen: Urine, Catheterized  Result Value Ref Range Status   Specimen Description   Final    URINE, CATHETERIZED Performed at [facility], [address] [place], [place] [address]    Special Requests   Final    NONE Performed at [facility], [address], [place], [place] [address]    Culture   Final    NO GROWTH Performed at [facility], [place] [address] [place], [place] [address]    Report Status 10/03/2018 FINAL  Final  Blood Culture (routine x 2)     Status: None   Collection Time: 10/01/18 10:36 AM   Specimen: BLOOD RIGHT HAND  Result Value Ref Range Status   Specimen Description   Final    BLOOD RIGHT HAND BOTTLES DRAWN AEROBIC AND ANAEROBIC   Special Requests Blood Culture adequate volume  Final   Culture   Final    NO GROWTH 5 DAYS Performed at [facility], [address] [place], [place] [address]    Report Status 10/06/2018 FINAL  Final  Blood Culture (routine x 2)     Status: None   Collection Time: 10/01/18 10:36 AM   Specimen: BLOOD LEFT HAND  Result Value Ref Range Status   Specimen Description   Final    BLOOD LEFT HAND BOTTLES DRAWN AEROBIC AND ANAEROBIC   Special Requests Blood Culture adequate volume  Final   Culture   Final    NO GROWTH 5 DAYS Performed at [facility], [address] [place], [place] [address]    Report Status 10/06/2018 FINAL  Final      Radiology Studies: No results found.  Scheduled Meds: . amLODipine  10 mg Oral Daily  . dexamethasone (DECADRON) injection  6 mg Intravenous Q24H  . enoxaparin (LOVENOX) injection  50 mg Subcutaneous Q12H  . famotidine  20 mg Oral Daily  . folic acid  1 mg Oral Daily  . insulin aspart  0-20 Units Subcutaneous TID WC  . insulin aspart  3 Units Subcutaneous TID WC  . insulin glargine  14 Units Subcutaneous BID  . ipratropium  2 puff Inhalation Q6H  . metoprolol succinate  50 mg Oral BID  . morphine  30 mg Oral Q12H  . multivitamin with minerals  1 tablet Oral Daily  . oxybutynin  10 mg Oral Daily  . pregabalin  150 mg Oral QID  . sodium chloride flush  3 mL Intravenous Q12H  . thiamine  100 mg Oral Daily  . topiramate  50 mg  Oral BID  . venlafaxine XR  37.5 mg Oral QHS  .  vitamin C  500 mg Oral Daily  . zinc sulfate  220 mg Oral Daily   Continuous Infusions: . sodium chloride       LOS: 5 days   Time spent: 35 minutes.  Patrecia Pour, MD [facility] [email or web address] Password [facility] 10/06/2018, 11:07 AM

## 2018-10-06 NOTE — Care Management Important Message (Signed)
Important Message  Patient Details  Name: Gina Hill MRN: [id number] Date of Birth: July 25, 1960   Medicare Important Message Given:  Yes - Important Message mailed due to current National Emergency  Verbal consent obtained due to current National Emergency  Relationship to patient: Child Contact Name: Sheran Fava Call Date: 10/06/18  Time: 1335 Phone: [phone] Outcome: Spoke with contact Important Message mailed to: Other (must enter comment)(emailed to [email or web address])   Aryam Zhan P [place] Maiden 10/06/2018, 1:36 PM

## 2018-10-06 NOTE — Progress Notes (Signed)
Pt taken to CT w RN escort at this time.

## 2018-10-06 NOTE — Progress Notes (Signed)
Results for SKYLEA, MEINE (MRN [id number]) as of 10/06/2018 07:35  Ref. Range 10/06/2018 02:30  Potassium Latest Ref Range: 3.5 - 5.1 mmol/L 3.2 (L)   Notified MD.

## 2018-10-07 ENCOUNTER — Inpatient Hospital Stay (HOSPITAL_COMMUNITY)
Admit: 2018-10-07 | Discharge: 2018-10-07 | Disposition: A | Discharge status: Home / Self Care | Payer: Medicare Other | Attending: Family Medicine | Admitting: Family Medicine

## 2018-10-07 DIAGNOSIS — G9341 Metabolic encephalopathy: Secondary | ICD-10-CM

## 2018-10-07 LAB — COMPREHENSIVE METABOLIC PANEL
ALT: 53 U/L — ABNORMAL HIGH (ref 0–44)
AST: 32 U/L (ref 15–41)
Albumin: 2.9 g/dL — ABNORMAL LOW (ref 3.5–5.0)
Alkaline Phosphatase: 50 U/L (ref 38–126)
Anion gap: 8 (ref 5–15)
BUN: 26 mg/dL — ABNORMAL HIGH (ref 6–20)
CO2: 25 mmol/L (ref 22–32)
Calcium: 10.8 mg/dL — ABNORMAL HIGH (ref 8.9–10.3)
Chloride: 109 mmol/L (ref 98–111)
Creatinine, Ser: 0.6 mg/dL (ref 0.44–1.00)
GFR calc Af Amer: >60 mL/min (ref >60)
GFR calc non Af Amer: >60 mL/min (ref >60)
Glucose, Bld: 49 mg/dL — ABNORMAL LOW (ref 70–99)
Potassium: 3.4 mmol/L — ABNORMAL LOW (ref 3.5–5.1)
Sodium: 142 mmol/L (ref 135–145)
Total Bilirubin: 0.8 mg/dL (ref 0.3–1.2)
Total Protein: 6.4 g/dL — ABNORMAL LOW (ref 6.5–8.1)

## 2018-10-07 LAB — GLUCOSE, CAPILLARY
Glucose-Capillary: 80 mg/dL (ref 70–99)
Glucose-Capillary: 157 mg/dL — ABNORMAL HIGH (ref 70–99)
Glucose-Capillary: 239 mg/dL — ABNORMAL HIGH (ref 70–99)
Glucose-Capillary: 227 mg/dL — ABNORMAL HIGH (ref 70–99)

## 2018-10-07 LAB — C-REACTIVE PROTEIN: CRP: 1 mg/dL — ABNORMAL HIGH (ref <1.0)

## 2018-10-07 MED ORDER — NALOXONE HCL 0.4 MG/ML IJ SOLN
0.1000 mg | INTRAMUSCULAR | Status: DC | PRN
  Administered 2018-10-07: 0.2 mg via INTRAVENOUS
  Filled 2018-10-07: qty 1

## 2018-10-07 MED ORDER — TOPIRAMATE 25 MG PO TABS
25.0000 mg | ORAL_TABLET | Freq: Two times a day (BID) | ORAL | Status: DC
  Administered 2018-10-07: 25 mg via ORAL
  Filled 2018-10-07 (×3): qty 1

## 2018-10-07 NOTE — Progress Notes (Addendum)
Occupational Therapy Treatment Patient Details Name: Gina Hill MRN: [id number] DOB: 23-Oct-1960 Today's Date: 10/07/2018    History of present illness 58 yo female presenting to ED with AMS and fever. Tested COVID positive. PMH including DM, depression, chronic pain syndrome, breast cancer, HTN, morbid obesity, and prior back sx (per pt report).    OT comments  Pt seen for OT treatment session, requires encouragement to participate initially but ultimately agreeable to transition OOB to recliner. Pt requiring modA to stand and RW and minA for stand pivot transfer. Did not attempt further mobility as pt with jerking noted to bil LEs and mild buckling noted with transfer completion. Pt also with notable cognitive impairments and requires max multimodal cues for all instruction. Pt often perseverating on few thoughts/sentences and difficult to redirect. Attempted to perform Short Blessed Test to further assess cognition but pt unable to maintain attention for successful completion (due to perseveration). Pt on RA with O2 sats >90% throughout. Given current status have updated discharge recommendations. Feel pt may require SNF level therapies at time of discharge (unless family/caregivers able to provide consistent 24hr supervision/assist). Will continue to follow acutely.   Follow Up Recommendations  SNF;Supervision/Assistance - 24 hour(pending progress, may progress to Home with [facility])    Equipment Recommendations  3 in 1 bedside commode          Precautions / Restrictions Precautions Precautions: Fall Precaution Comments: demonstrates 'DROPSY" OF ARMS AND LEGS AFTER AMBULATING. PT. REPORTS " I AM HAVING THE SHAKES" during last PT session on 9/16 Restrictions Weight Bearing Restrictions: No       Mobility Bed Mobility Overal bed mobility: Needs Assistance Bed Mobility: Supine to Sit     Supine to sit: Min guard;HOB elevated     General bed mobility comments: for lines and  safety  Transfers Overall transfer level: Needs assistance Equipment used: Rolling walker (2 wheeled) Transfers: Risk manager;Sit to/from Stand Sit to Stand: Mod assist Stand pivot transfers: Min assist       General transfer comment: pt required increased boosting assist from EOB, multimodal cues for safe hand placement however pt unable to process instruction. once in standing pt with jerkiness noted to LEs and notable LE weakness, able to safely take steps to turn towards recliner with assist to push/transition RW, once in front of recliner pt with additional LE weakness/mild buckling and "plopping" into recliner; max multimodal cues required throughout    Balance Overall balance assessment: Needs assistance Sitting-balance support: No upper extremity supported;Feet supported Sitting balance-Leahy Scale: Good     Standing balance support: Bilateral upper extremity supported Standing balance-Leahy Scale: Poor                             ADL either performed or assessed with clinical judgement   ADL Overall ADL's : Needs assistance/impaired                                     Functional mobility during ADLs: Minimal assistance;Moderate assistance;Rolling walker(stand pivot transfer) General ADL Comments: focus of session on transfer OOB to recliner and attempts at cognitive assessment. pt too distracted to participate further in ADL                       Cognition Arousal/Alertness: Awake/alert;Lethargic(intermittently lethargic) Behavior During Therapy: Flat affect Overall Cognitive Status: Impaired/Different from  baseline Area of Impairment: Attention;Memory;Following commands;Safety/judgement;Awareness;Problem solving                   Current Attention Level: Focused Memory: Decreased recall of precautions;Decreased short-term memory Following Commands: Follows one step commands inconsistently;Follows one step commands  with increased time Safety/Judgement: Decreased awareness of safety;Decreased awareness of deficits Awareness: Intellectual Problem Solving: Slow processing;Decreased initiation;Difficulty sequencing;Requires verbal cues;Requires tactile cues General Comments: pt with delayed processing, often perseverating on certain thoughts/statements during session and difficult to redirect. Initially asked pt about shower setup and whether she has a shower seat - pt perseverating on having a walk-in shower (which differs from response provided on initial eval) and does not answer further questions. Short Blessed Test attempted and pt unable to sustain attention long enough to complete due to further perseveration. took x3 attempts for pt to understand and tell therapist the year, states the month is October but after asking the month pt perseverating on the date and unable to be redirected (pt concerned with having missed family members birthday). pt requires increased encouragement and multimodal cues for following simple commands.         Exercises     Shoulder Instructions       General Comments O2 sats stable on RA    Pertinent Vitals/ Pain       Pain Assessment: Faces Faces Pain Scale: No hurt Pain Intervention(s): Monitored during session  Home Living                                          Prior Functioning/Environment              Frequency  Min 3X/week        Progress Toward Goals  OT Goals(current goals can now be found in the care plan section)  Progress towards OT goals: OT to reassess next treatment  Acute Rehab OT Goals Patient Stated Goal: "Go home" OT Goal Formulation: With patient Time For Goal Achievement: 10/16/18 Potential to Achieve Goals: Good ADL Goals Pt Will Perform Grooming: standing;with modified independence Pt Will Perform Lower Body Dressing: with modified independence;sit to/from stand Pt Will Transfer to Toilet: with modified  independence;ambulating;bedside commode Pt Will Perform Toileting - Clothing Manipulation and hygiene: with modified independence;sit to/from stand;sitting/lateral leans Pt/caregiver will Perform Home Exercise Program: Increased strength;Both right and left upper extremity;Independently;With written HEP provided;With theraband Additional ADL Goal #1: Pt will independently verbalize three energy conservation strategies for ADLs Additional ADL Goal #2: Pt will demonstrate alternating attention between two ADLs with min cues  Plan Discharge plan needs to be updated    Co-evaluation                 AM-PAC OT "6 Clicks" Daily Activity     Outcome Measure   Help from another person eating meals?: A Little Help from another person taking care of personal grooming?: A Little Help from another person toileting, which includes using toliet, bedpan, or urinal?: A Lot Help from another person bathing (including washing, rinsing, drying)?: A Lot Help from another person to put on and taking off regular upper body clothing?: A Little Help from another person to put on and taking off regular lower body clothing?: A Lot 6 Click Score: 15    End of Session Equipment Utilized During Treatment: Gait belt;Rolling walker  OT Visit Diagnosis: Unsteadiness on feet (R26.81);Other abnormalities  of gait and mobility (R26.89);Muscle weakness (generalized) (M62.81);Other symptoms and signs involving cognitive function   Activity Tolerance Patient tolerated treatment well   Patient Left in chair;with call bell/phone within reach;with chair alarm set   Nurse Communication Mobility status        Time: [id number] OT Time Calculation (min): 25 min  Charges: OT General Charges $OT Visit: 1 Visit OT Treatments $Self Care/Home Management : 8-22 mins $Cognitive Funtion inital: Initial 15 mins  Lou Cal, OT [facility] Pager [phone] Office  [phone]   Raymondo Band 10/07/2018, 1:25 PM

## 2018-10-07 NOTE — Progress Notes (Addendum)
RN called to report that patient had 2 butrans patches on!  Butrans patches removed, RN trying small doses of narcan to see if this helps (patient is drowsy but arrousable to voice, satting okay).  MS contin is also being held.  Update: patient more verbal and awake after 0.2 of narcan, ordering tele monitor

## 2018-10-07 NOTE — [place] FL2 (Signed)
[place] MEDICAID FL2 LEVEL OF CARE SCREENING TOOL     IDENTIFICATION  Patient Name: Gina Hill Birthdate: [birth year] Sex: female Admission Date (Current Location): 10/01/2018  [facility] and [place] Number:  Herbalist and Address:  [facility], [place] [address] [place], [place] [address]([facility] [place])      Provider Number: [id number]  Attending Physician Name and Address:  Patrecia Pour, MD  Relative Name and Phone Number:  Sheran Fava  [phone]     daughter    Current Level of Care: Hospital Recommended Level of Care: [place] Prior Approval Number:    Date Approved/Denied:   PASRR Number: [id number]  Discharge Plan: SNF    Current Diagnoses: Patient Active Problem List   Diagnosis Date Noted  . Acute respiratory disease due to COVID-19 virus 10/01/2018  . Acute Hypoxic Respiratory failure, acute--- due to COVID-19 10/01/2018  . Pneumonia due to COVID-19 virus 10/01/2018  . Breast cancer of upper-outer quadrant of right female breast ([place]) 11/05/2015  . Breast mass, right 05/08/2013  . Hypertension 02/26/2012  . Diabetes ([place]) 02/26/2012  . Chronic pelvic pain in female 02/26/2012  . Obese   . Endometriosis     Orientation RESPIRATION BLADDER Height & Weight     Self, Place, Situation  Normal Continent Weight: 102.1 kg Height:  5\' 7"  (170.2 cm)  BEHAVIORAL SYMPTOMS/MOOD NEUROLOGICAL BOWEL NUTRITION STATUS      Incontinent, Continent Diet  AMBULATORY STATUS COMMUNICATION OF NEEDS Skin   Extensive Assist Verbally Normal                       Personal Care Assistance Level of Assistance  Bathing, Dressing Bathing Assistance: Maximum assistance   Dressing Assistance: Maximum assistance Total Care Assistance: Maximum assistance   Functional Limitations Info             SPECIAL CARE FACTORS FREQUENCY  PT (By licensed PT), OT (By licensed OT)     PT Frequency:  5x/week OT Frequency: min 3x/week            Contractures Contractures Info: Present    Additional Factors Info  Code Status, Isolation Precautions, Allergies Code Status Info: Full Code Allergies Info: Gadolinium, Iodine, medroxyprogesterone, Tape, Codiene     Isolation Precautions Info: COVID positive     Current Medications (10/07/2018):  This is the current hospital active medication list Current Facility-Administered Medications  Medication Dose Route Frequency Provider Last Rate Last Dose  . 0.9 %  sodium chloride infusion  250 mL Intravenous PRN Emokpae, Courage, MD      . acetaminophen (TYLENOL) tablet 650 mg  650 mg Oral Q6H PRN Roxan Hockey, MD   650 mg at 10/03/18 2213  . amLODipine (NORVASC) tablet 10 mg  10 mg Oral Daily Cherene Altes, MD   10 mg at 10/07/18 0955  . benzonatate (TESSALON) capsule 100 mg  100 mg Oral TID PRN Roxan Hockey, MD   100 mg at 10/02/18 1432  . dexamethasone (DECADRON) injection 6 mg  6 mg Intravenous Q24H Cherene Altes, MD   6 mg at 10/07/18 0957  . enoxaparin (LOVENOX) injection 50 mg  50 mg Subcutaneous Q12H Cherene Altes, MD   50 mg at 10/07/18 1703  . famotidine (PEPCID) tablet 20 mg  20 mg Oral Daily Cherene Altes, MD   20 mg at 10/07/18 0955  . folic acid (FOLVITE) tablet 1 mg  1 mg Oral Daily Emokpae, Courage, MD   1 mg at 10/07/18 1002  . guaiFENesin-dextromethorphan (ROBITUSSIN DM) 100-10 MG/5ML syrup 10 mL  10 mL Oral Q4H PRN Denton Brick, Courage, MD   10 mL at 10/05/18 0818  . hydrALAZINE (APRESOLINE) injection 10 mg  10 mg Intravenous Q6H PRN Emokpae, Courage, MD      . insulin aspart (novoLOG) injection 0-20 Units  0-20 Units Subcutaneous TID WC Denton Brick, Courage, MD   7 Units at 10/07/18 1656  . insulin aspart (novoLOG) injection 3 Units  3 Units Subcutaneous TID WC Patrecia Pour, MD   3 Units at 10/07/18 1653  . insulin glargine (LANTUS) injection 14 Units  14 Units Subcutaneous BID Cherene Altes, MD    14 Units at 10/07/18 0957  . ipratropium (ATROVENT HFA) inhaler 2 puff  2 puff Inhalation Q6H Emokpae, Courage, MD   2 puff at 10/07/18 1346  . loperamide (IMODIUM) capsule 2 mg  2 mg Oral PRN Cherene Altes, MD   2 mg at 10/03/18 2213  . metoprolol succinate (TOPROL-XL) 24 hr tablet 50 mg  50 mg Oral BID Roxan Hockey, MD   50 mg at 10/07/18 0956  . morphine (MS CONTIN) 12 hr tablet 30 mg  30 mg Oral Q12H Cherene Altes, MD   30 mg at 10/07/18 [id number]  . multivitamin with minerals tablet 1 tablet  1 tablet Oral Daily Roxan Hockey, MD   1 tablet at 10/07/18 0956  . ondansetron (ZOFRAN) tablet 4 mg  4 mg Oral Q6H PRN Emokpae, Courage, MD       Or  . ondansetron (ZOFRAN) injection 4 mg  4 mg Intravenous Q6H PRN Emokpae, Courage, MD   4 mg at 10/03/18 1315  . oxybutynin (DITROPAN-XL) 24 hr tablet 10 mg  10 mg Oral Daily Emokpae, Courage, MD   10 mg at 10/07/18 0956  . pregabalin (LYRICA) capsule 150 mg  150 mg Oral QID Denton Brick, Courage, MD   150 mg at 10/07/18 1702  . sodium chloride flush (NS) 0.9 % injection 3 mL  3 mL Intravenous Q12H Emokpae, Courage, MD   3 mL at 10/07/18 0959  . sodium chloride flush (NS) 0.9 % injection 3 mL  3 mL Intravenous PRN Emokpae, Courage, MD      . thiamine (VITAMIN B-1) tablet 100 mg  100 mg Oral Daily Emokpae, Courage, MD   100 mg at 10/07/18 0955  . topiramate (TOPAMAX) tablet 25 mg  25 mg Oral BID Patrecia Pour, MD   25 mg at 10/07/18 0953  . traZODone (DESYREL) tablet 50 mg  50 mg Oral QHS PRN Roxan Hockey, MD   50 mg at 10/03/18 2213  . venlafaxine XR (EFFEXOR-XR) 24 hr capsule 37.5 mg  37.5 mg Oral QHS Emokpae, Courage, MD   37.5 mg at 10/06/18 2121  . vitamin C (ASCORBIC ACID) tablet 500 mg  500 mg Oral Daily Emokpae, Courage, MD   500 mg at 10/07/18 0957  . zinc sulfate capsule 220 mg  220 mg Oral Daily Emokpae, Courage, MD   220 mg at 10/07/18 0957     Discharge Medications: Please see discharge summary for a list of discharge  medications.  Relevant Imaging Results:  Relevant Lab Results:   Additional Information SS#  [id number]  Ninfa Meeker, RN

## 2018-10-07 NOTE — Progress Notes (Signed)
PROGRESS NOTE  Gina Hill  [id number] DOB: 07-02-1960 DOA: 10/01/2018 PCP: Dion Body, MD, PCP in [place] is with [facility].   Brief Narrative: Gina Hill is a 58 y.o. female with a history of T2DM, morbid obesity, HTN, and chronic pain syndrome on chronic opioid medication who was brought to the ED 9/12 by EMS for fevers up to 104F and altered mental status in the setting of URI symptoms and cough for 2-3 days. In the ED the patient was febrile to 102.30F and hypoxic to 82% on room air with bilateral infiltrates on CXR and positive SARS-CoV-2 PCR test.     Assessment & Plan: Principal Problem:   Acute Hypoxic Respiratory failure, acute--- due to COVID-19 Active Problems:   Hypertension   Diabetes ([place])   Acute respiratory disease due to COVID-19 virus   Pneumonia due to COVID-19 virus   Acute hypoxic respiratory failure due to covid-19 pneumonia and possible CAP: Hypoxia improving. Onset of illness on 9/9 put her at risk of decompensation around this time.  - Completed 5 days of remdesivir (9/12 - 9/16) - Completed cefepime x5 days (9/12 - 9/16) - Continue steroids x 10 days. - Continue airborne, contact precautions. PPE including surgical gown, gloves, cap, shoe covers, and CAPR used during this encounter in a negative pressure room.  - Check daily labs: CBC w/diff, CMP, d-dimer, ferritin, CRP. Inflammatory markers with downward trend (CRP 20 >> 1.0).  - Enoxaparin prophylactic dose. - Maintain euvolemia/net negative.  - Avoid NSAIDs - Recommend proning and aggressive use of incentive spirometry.  Acute metabolic encephalopathy: Presentation is most consistent with global insult. With no documented hypotension nor profound hypoxemia, most likely related to prolonged hypoglycemia prior to admission. No suggestive signs of PRES. Polypharmacy, possibly psychogenic component with restricted affect also likely contributing. Nonfocal exam. - I have discussed with Dr.  Lorraine Lax, neurology, who will evaluate the patient today. - Avoid hypoglycemia. Pt is brittle diabetic, would permit some hyperglycemia. - TSH is wnl. RPR NR. CT head without acute findings and minimal chronic microvascular findings. - Decreased sedating medications as below - Has completed cefepime. Neurology feels this could have contributed.  - Check EEG, r/o nonconvulsive seizures. - Delirium precautions.  Chronic pain syndrome, lumbar stenosis with sciatica, depression:  - Buprenorphine 16mcg/hr patch stopped, MS contin replacing this. Under the care of Dr. Kathi Der as outpatient, has gotten injections. Had RFA performed earlier this year and has reported decline in functional status since that time with planned repeat procedure later this year. - Continue lyrica and effexor. Also on topiramate which is known to cause confusion, decreasing dose.  Elevated d-dimer: Initially >20, rapidly decreased without therapuetic anticoagulation and no longer hypoxic without symptoms of DVT.  - Continue prophylactic lovenox, no further workup currently planned.   T2DM with hyperglycemia: Steroid-induced worsening noted. Prolonged hypoglycemia reported PTA by family concomitant with persistent mental status changes.  - Continue SSI and lantus, added low dose mealtime insulin given persistent postprandial elevation. Am CBGs have been at goal with the exception of 9/18 after receiving additional 10u novolog overnight. - Holding home medications. HbA1c 6.6% indicates likely too tight of control in frail patient.   Covid-19 gastroenteritis: Improving clinically - Continue supportive care  LFT elevation: Mild. Likely due to viral infection. Ammonia normal.  - Continue monitoring.  Morbid obesity: BMI 35.  - Weight loss recommended long term  Hypokalemia: Improved today but still slightly low. - Supplement again today.   Hypomagnesemia:  - Supplement as needed  to maintain goal of 2.   HTN:  -  Continue metoprolol - Monitor and adjust regimen as necessary.   Normocytic anemia: Possibly due to acute illness  - Monitor for bleeding.  DVT prophylaxis: Lovenox 0.5mg /kg q24h = 50mg  Code Status: Full Family Communication: None at bedside. Discussing with daughter daily. Disposition Plan: Continue work up for encephalopathy, neurology consulted. CM consulted to assist with home health needs, complicated long term home healthcare due to pt now having covid-19 infection.   Consultants:   PT/OT  Neurology  Procedures:   None  Antimicrobials:  Remdesivir 9/12 - 9/16  Vancomycin 9/12  Flagyl 9/12  Cefepime 9/12 - 9/16  Subjective: No complaints at all. Denies shortness of breath chest pain, pain anywhere. Had high blood sugar last night, given 10u insulin, then down into 40's this AM.  Objective: Vitals:   10/07/18 0600 10/07/18 0700 10/07/18 0955 10/07/18 0956  BP:   109/69 (!) 110/59  Pulse: 67 (!) 55  66  Resp:      Temp:      TempSrc:      SpO2: 96% 94%    Weight:      Height:        Intake/Output Summary (Last 24 hours) at 10/07/2018 1038 Last data filed at 10/07/2018 0600 Gross per 24 hour  Intake 0 ml  Output 600 ml  Net -600 ml   Filed Weights   10/01/18 0955 10/01/18 1216  Weight: 107.5 kg 102.1 kg   Gen: 58 y.o. female in no distress Pulm: Nonlabored breathing room air. Clear. CV: Regular rate and rhythm. No murmur, rub, or gallop. No JVD, no dependent edema. GI: Abdomen soft, non-tender, non-distended, with normoactive bowel sounds.  Ext: Warm, no deformities Skin: No rashes, lesions or ulcers on visualized skin. Gown not removed. Neuro: Alert, oriented to "hospital because I'm sick." Otherwise repeats short pharses like "yeah, I remember you." No aphasia/dysphasia, visual field cuts to confrontation, or focal neurological deficits x4 extremities. Psych: Judgement and insight appear impaired, repetitive phrases without clear linear thought  process. Remains calm and pleasant.  Data Reviewed: I have personally reviewed following labs and imaging studies  CBC: Recent Labs  Lab 10/02/18 0535 10/03/18 0304 10/04/18 0240 10/05/18 0320 10/06/18 0230  WBC 8.3 9.4 9.9 8.6 8.8  NEUTROABS 7.1 7.8* 8.2* 6.0 5.9  HGB 11.4* 11.6* 11.4* 11.7* 12.3  HCT 35.0* 35.0* 34.5* 35.5* 37.9  MCV 98.6 97.2 97.5 98.9 99.0  PLT 262 305 323 331 [id number]   Basic Metabolic Panel: Recent Labs  Lab 10/03/18 0304 10/04/18 0240 10/05/18 0320 10/06/18 0230 10/07/18 0234  NA 142 142 143 143 142  K 3.3* 3.5 3.6 3.2* 3.4*  CL 109 109 111 108 109  CO2 22 22 24 24 25   GLUCOSE 309* 287* 203* 111* 49*  BUN 23* 31* 30* 24* 26*  CREATININE 0.73 0.77 0.75 0.67 0.60  CALCIUM 10.6* 10.9* 10.9* 11.1* 10.8*  MG  --  1.8  --   --   --    GFR: Estimated Creatinine Clearance: 96.4 mL/min (by C-G formula based on SCr of 0.6 mg/dL). Liver Function Tests: Recent Labs  Lab 10/03/18 0304 10/04/18 0240 10/05/18 0320 10/06/18 0230 10/07/18 0234  AST 35 22 20 31  32  ALT 46* 37 38 46* 53*  ALKPHOS 53 53 52 54 50  BILITOT 0.9 0.6 0.7 0.5 0.8  PROT 7.4 6.7 6.7 7.0 6.4*  ALBUMIN 3.1* 3.0* 3.0* 3.2* 2.9*   No results for  input(s): LIPASE, AMYLASE in the last 168 hours. Recent Labs  Lab 10/05/18 0320  AMMONIA 30   Coagulation Profile: Recent Labs  Lab 10/01/18 1034  INR 1.2   Cardiac Enzymes: No results for input(s): CKTOTAL, CKMB, CKMBINDEX, TROPONINI in the last 168 hours. BNP (last 3 results) No results for input(s): PROBNP in the last 8760 hours. HbA1C: Recent Labs    10/06/18 0230  HGBA1C 6.6*   CBG: Recent Labs  Lab 10/06/18 0811 10/06/18 1153 10/06/18 1710 10/06/18 2132 10/07/18 0830  GLUCAP 125* 113* 258* 468* 80   Lipid Profile: No results for input(s): CHOL, HDL, LDLCALC, TRIG, CHOLHDL, LDLDIRECT in the last 72 hours. Thyroid Function Tests: Recent Labs    10/06/18 0230  TSH 0.458   Anemia Panel: Recent Labs     10/05/18 0320 10/06/18 0230  VITAMINB12 4,955*  --   FOLATE 29.0  --   FERRITIN 943* 820*   Urine analysis:    Component Value Date/Time   COLORURINE YELLOW 10/01/2018 0959   APPEARANCEUR HAZY (A) 10/01/2018 0959   LABSPEC 1.035 (H) 10/01/2018 0959   PHURINE 5.0 10/01/2018 0959   GLUCOSEU NEGATIVE 10/01/2018 0959   HGBUR SMALL (A) 10/01/2018 0959   BILIRUBINUR NEGATIVE 10/01/2018 0959   KETONESUR 5 (A) 10/01/2018 0959   PROTEINUR >=300 (A) 10/01/2018 0959   UROBILINOGEN 1.0 05/28/2009 2049   NITRITE NEGATIVE 10/01/2018 0959   LEUKOCYTESUR NEGATIVE 10/01/2018 0959   Recent Results (from the past 240 hour(s))  SARS Coronavirus 2 [facility] order, Performed in [facility] hospital lab) Nasopharyngeal Nasopharyngeal Swab     Status: Abnormal   Collection Time: 10/01/18  9:50 AM   Specimen: Nasopharyngeal Swab  Result Value Ref Range Status   SARS Coronavirus 2 POSITIVE (A) NEGATIVE Final    Comment: RESULT CALLED TO, READ BACK BY AND VERIFIED WITH: MCMANUS @ 1256 ON [id number] BY HENDERSON L. (NOTE) If result is NEGATIVE SARS-CoV-2 target nucleic acids are NOT DETECTED. The SARS-CoV-2 RNA is generally detectable in upper and lower  respiratory specimens during the acute phase of infection. The lowest  concentration of SARS-CoV-2 viral copies this assay can detect is 250  copies / mL. A negative result does not preclude SARS-CoV-2 infection  and should not be used as the sole basis for treatment or other  patient management decisions.  A negative result may occur with  improper specimen collection / handling, submission of specimen other  than nasopharyngeal swab, presence of viral mutation(s) within the  areas targeted by this assay, and inadequate number of viral copies  (<250 copies / mL). A negative result must be combined with clinical  observations, patient history, and epidemiological information. If result is POSITIVE SARS-CoV-2 target nucleic acids are DETEC TED. The  SARS-CoV-2 RNA is generally detectable in upper and lower  respiratory specimens during the acute phase of infection.  Positive  results are indicative of active infection with SARS-CoV-2.  Clinical  correlation with patient history and other diagnostic information is  necessary to determine patient infection status.  Positive results do  not rule out bacterial infection or co-infection with other viruses. If result is PRESUMPTIVE POSTIVE SARS-CoV-2 nucleic acids MAY BE PRESENT.   A presumptive positive result was obtained on the submitted specimen  and confirmed on repeat testing.  While 2019 novel coronavirus  (SARS-CoV-2) nucleic acids may be present in the submitted sample  additional confirmatory testing may be necessary for epidemiological  and / or clinical management purposes  to differentiate between  SARS-CoV-2 and other Sarbecovirus currently known to infect humans.  If clinically indicated additional testing with an alternate test  methodology ([id number] [id number]) is advised. The SARS-CoV-2 RNA is generally  detectable in upper and lower respiratory specimens during the acute  phase of infection. The expected result is Negative. Fact Sheet for Patients:  StrictlyIdeas.no Fact Sheet for Healthcare Providers: BankingDealers.co.za This test is not yet approved or cleared by the [place] FDA and has been authorized for detection and/or diagnosis of SARS-CoV-2 by FDA under an Emergency Use Authorization (EUA).  This EUA will remain in effect (meaning this test can be used) for the duration of the COVID-19 declaration under Section 564(b)(1) of the Act, 21 U.S.C. section 360bbb-3(b)(1), unless the authorization is terminated or revoked sooner. Performed at [facility], [address] [place], [place] [address]   Urine culture     Status: None   Collection Time: 10/01/18  9:59 AM   Specimen: Urine, Catheterized  Result Value Ref Range  Status   Specimen Description   Final    URINE, CATHETERIZED Performed at [facility], [address], [place], [place] [address]    Special Requests   Final    NONE Performed at [facility], [address], [place], [place] [address]    Culture   Final    NO GROWTH Performed at [facility], [place] [address], [place], [place] [address]    Report Status 10/03/2018 FINAL  Final  Blood Culture (routine x 2)     Status: None   Collection Time: 10/01/18 10:36 AM   Specimen: BLOOD RIGHT HAND  Result Value Ref Range Status   Specimen Description   Final    BLOOD RIGHT HAND BOTTLES DRAWN AEROBIC AND ANAEROBIC   Special Requests Blood Culture adequate volume  Final   Culture   Final    NO GROWTH 5 DAYS Performed at [facility], [address], [place], [place] [address]    Report Status 10/06/2018 FINAL  Final  Blood Culture (routine x 2)     Status: None   Collection Time: 10/01/18 10:36 AM   Specimen: BLOOD LEFT HAND  Result Value Ref Range Status   Specimen Description   Final    BLOOD LEFT HAND BOTTLES DRAWN AEROBIC AND ANAEROBIC   Special Requests Blood Culture adequate volume  Final   Culture   Final    NO GROWTH 5 DAYS Performed at [facility], [address] [place],  [address]    Report Status 10/06/2018 FINAL  Final      Radiology Studies: Ct Head Wo Contrast  Result Date: 10/06/2018 CLINICAL DATA:  Altered mental status with diminished upper extremity dexterity EXAM: CT HEAD WITHOUT CONTRAST TECHNIQUE: Contiguous axial images were obtained from the base of the skull through the vertex without intravenous contrast. COMPARISON:  May 28, 2009 FINDINGS: Brain: Ventricles and sulci are within normal limits for age. There is no intracranial mass, hemorrhage, extra-axial fluid collection, or midline shift. There is slight small vessel disease in the centra semiovale bilaterally. Elsewhere brain parenchyma appears unremarkable. No acute infarct is evident. Vascular:  There is no appreciable hyperdense vessel. There is no appreciable vascular calcification. Skull: The bony calvarium appears intact. Sinuses/Orbits: There is mucosal thickening in several ethmoid air cells. Other paranasal sinuses are clear. Orbits appear symmetric bilaterally. Other: Visualized mastoid air cells are clear. IMPRESSION: Slight periventricular small vessel disease. No acute infarct. No mass or hemorrhage. There is mucosal thickening in several ethmoid  air cells. Electronically Signed   By: Lowella Grip III M.D.   On: 10/06/2018 15:07    Scheduled Meds:  amLODipine  10 mg Oral Daily   dexamethasone (DECADRON) injection  6 mg Intravenous Q24H   enoxaparin (LOVENOX) injection  50 mg Subcutaneous Q12H   famotidine  20 mg Oral Daily   folic acid  1 mg Oral Daily   insulin aspart  0-20 Units Subcutaneous TID WC   insulin aspart  3 Units Subcutaneous TID WC   insulin glargine  14 Units Subcutaneous BID   ipratropium  2 puff Inhalation Q6H   metoprolol succinate  50 mg Oral BID   morphine  30 mg Oral Q12H   multivitamin with minerals  1 tablet Oral Daily   oxybutynin  10 mg Oral Daily   pregabalin  150 mg Oral QID   sodium chloride flush  3 mL Intravenous Q12H   thiamine  100 mg Oral Daily   topiramate  25 mg Oral BID   venlafaxine XR  37.5 mg Oral QHS   vitamin C  500 mg Oral Daily   zinc sulfate  220 mg Oral Daily   Continuous Infusions:  sodium chloride       LOS: 6 days   Time spent: 35 minutes.  Patrecia Pour, MD [facility] [email or web address] Password [facility] 10/07/2018, 10:38 AM

## 2018-10-07 NOTE — Consult Note (Signed)
Requesting Physician: Dr. Bonner Puna    Chief Complaint: Preservation of speech, confusion   History obtained from: Patient and Chart    HPI:                                                                                                                                       Bita Wagle is a 58 y.o. female with past medical history significant for morbid obesity, type 2 diabetes mellitus, hypertension, chronic opiate medication admitted to [facility] SARS-CoV-2 infection.  Also on arrival noted to be hypoglycemic with blood sugar of 42 as well as low blood sugars prior to admission. Since admission patient has completed 5 days of cefepime and 5 days of remdesivir.  She also is currently being treated with steroids.   Patient also has a history of chronic pain and is on multiple pain medications including opioids, Lyrica, Effexor as well as topiramate.  Since admission patient has remained confused and noted to perseverate speech.  Neurology consulted for confusion.   Past Medical History:  Diagnosis Date  . Arthritis   . Asthma   . Cancer ([place])    right breast  . Chronic back pain   . Chronic pelvic pain in female   . Complication of anesthesia    SOB after anesthesia so " I usually have to get a breathing treatment after anesthesia."  . Diabetes mellitus without complication ([place])   . Endometriosis   . GERD (gastroesophageal reflux disease)   . Hypercholesterolemia   . Hyperparathyroidism ([place])   . Hypertension   . Neuropathic pain   . Obese     Past Surgical History:  Procedure Laterality Date  . BREAST BIOPSY     Right  . CESAREAN SECTION    . FOOT SURGERY     Left foot  . LAPAROSCOPIC LYSIS OF ADHESIONS    . LAPAROTOMY    . PORTACATH PLACEMENT Left 06/22/2013   Procedure: INSERTION PORT-A-CATH;  Surgeon: Merrie Roof, MD;  Location: [facility];  Service: General;  Laterality: Left;  . SALPINGECTOMY     Partial Right    . WISDOM TOOTH EXTRACTION      Family  History  Problem Relation Age of Onset  . Hypertension Mother   . Mental illness Mother   . Heart disease Mother   . Diabetes Father   . Hypertension Father   . Cancer Father        prostate  . Kidney disease Father   . Breast cancer Maternal Aunt   . Cancer Maternal Aunt        kidney   Social History:  reports that she has never smoked. She has never used smokeless tobacco. She reports that she does not drink alcohol or use drugs.  Allergies:  Allergies  Allergen Reactions  . Gadolinium Derivatives Other (See Comments)    Only PO contrast causes confusion and  diarrhea  . Iodine Diarrhea  . Medroxyprogesterone Nausea And Vomiting and Other (See Comments)    Does not want to take hormones   . Tape Other (See Comments)    Tears skin - paper tape OK  . Codeine Nausea And Vomiting    Medications:                                                                                                                        I reviewed home medications   ROS:                                                                                                                                     14 systems reviewed and negative except above    Examination:                                                                                                      General: Appears well-developed and well-nourished.  Psych: Affect flat Eyes: No scleral injection HENT: No OP obstrucion Head: Normocephalic.  Cardiovascular: Normal rate and regular rhythm.  Respiratory: Effort normal and breath sounds normal to anterior ascultation GI: Soft.  No distension. There is no tenderness.  Skin: WDI    Neurological Examination Mental Status: Patient is awake, alert to name place location and month.  Answers questions appropriately such as who is the president, who is the past president.  However, starts perseverating in the middle of conversation. Cranial Nerves: II: Visual fields grossly normal,   III,IV, VI: ptosis not present, extra-ocular motions intact bilaterally, pupils equal, round, reactive to light and accommodation V,VII: smile symmetric, facial light touch sensation normal bilaterally VIII: hearing normal bilaterally IX,X: uvula rises symmetrically XI: bilateral shoulder shrug XII: midline tongue extension Motor: Right : Upper extremity   5/5    Left:     Upper extremity   5/5  Lower extremity   4/5     Lower extremity   4/5 Tone and bulk:normal  tone throughout; no atrophy noted Sensory: Pinprick and light touch intact throughout, bilaterally Plantars: Right: downgoing   Left: downgoing Cerebellar: normal finger-to-nose      Lab Results: Basic Metabolic Panel: Recent Labs  Lab 10/03/18 0304 10/04/18 0240 10/05/18 0320 10/06/18 0230 10/07/18 0234  NA 142 142 143 143 142  K 3.3* 3.5 3.6 3.2* 3.4*  CL 109 109 111 108 109  CO2 22 22 24 24 25   GLUCOSE 309* 287* 203* 111* 49*  BUN 23* 31* 30* 24* 26*  CREATININE 0.73 0.77 0.75 0.67 0.60  CALCIUM 10.6* 10.9* 10.9* 11.1* 10.8*  MG  --  1.8  --   --   --     CBC: Recent Labs  Lab 10/02/18 0535 10/03/18 0304 10/04/18 0240 10/05/18 0320 10/06/18 0230  WBC 8.3 9.4 9.9 8.6 8.8  NEUTROABS 7.1 7.8* 8.2* 6.0 5.9  HGB 11.4* 11.6* 11.4* 11.7* 12.3  HCT 35.0* 35.0* 34.5* 35.5* 37.9  MCV 98.6 97.2 97.5 98.9 99.0  PLT 262 305 323 331 340    Coagulation Studies: No results for input(s): LABPROT, INR in the last 72 hours.  Imaging: Ct Head Wo Contrast  Result Date: 10/06/2018 CLINICAL DATA:  Altered mental status with diminished upper extremity dexterity EXAM: CT HEAD WITHOUT CONTRAST TECHNIQUE: Contiguous axial images were obtained from the base of the skull through the vertex without intravenous contrast. COMPARISON:  May 28, 2009 FINDINGS: Brain: Ventricles and sulci are within normal limits for age. There is no intracranial mass, hemorrhage, extra-axial fluid collection, or midline shift. There is slight  small vessel disease in the centra semiovale bilaterally. Elsewhere brain parenchyma appears unremarkable. No acute infarct is evident. Vascular: There is no appreciable hyperdense vessel. There is no appreciable vascular calcification. Skull: The bony calvarium appears intact. Sinuses/Orbits: There is mucosal thickening in several ethmoid air cells. Other paranasal sinuses are clear. Orbits appear symmetric bilaterally. Other: Visualized mastoid air cells are clear. IMPRESSION: Slight periventricular small vessel disease. No acute infarct. No mass or hemorrhage. There is mucosal thickening in several ethmoid air cells. Electronically Signed   By: Lowella Grip III M.D.   On: 10/06/2018 15:07     I have reviewed the above imaging : CT head no acute abnormality EEG: Suggestive of encephalopathy, no epileptiform discharges   ASSESSMENT AND PLAN  58 y.o. female with past medical history significant for morbid obesity, type 2 diabetes mellitus, hypertension, chronic opiate medication admitted to [facility] SARS-CoV-2 infection.   Examination is non focal, patient does not have any neck rigidity to raise suspicion for meningitis.  Likely toxic metabolic encephalopathy.  She has multiple risk factors to develop this including receiving cefepime for 5 days, being on steroids, h/o hypoglycemia, acutely ill with SARS-CoV-2 infection, also on multiple sedating medications as well as Topamax which can cause cognitive impairment and can worsen symptoms in the acute setting.  Her CT head is unremarkable for acute findings.  Stat EEG was also negative for seizures, nonconvulsive status epilepticus.  Metabolic work-up including ammonia, BUN normal.  TSH normal.  B12 elevated.    Toxic metabolic encephalopathy -likely multifactorial. 2/2 steroids, acutely ill with COVID-19, on multiple sedating medications and cefepime.  Recommendations EEG: Negative for epileptiform discharges Stop  Topamax Continue steroids for treatment of COVID-19, however maybe playing a role    If no improvement on completion of steroids, consider MRI brain   Sushanth Aroor [facility] Pager Number [phone]

## 2018-10-07 NOTE — Progress Notes (Signed)
PT Cancellation Note  Patient Details Name: Gina Hill MRN: [id number] DOB: Jun 04, 1960   Cancelled Treatment:    Reason Eval/Treat Not Completed: Patient at procedure or test/unavailable  Attempted to see ~15:15 and pt was trying to eat her lunch with no utensils (apparently not delivered on her tray by accident). Provided with utensils and pt agreed to work with PT after she ate.   Currently undergoing EEG.    Barry Brunner, PT     Rexanne Mano 10/07/2018, 4:14 PM

## 2018-10-07 NOTE — Progress Notes (Signed)
EEG complete - results pending 

## 2018-10-07 NOTE — Progress Notes (Signed)
0700 received bedside report, call light with in reach, board updated 0800 assessment and vs taken and charted 0900 spoke with pt daughter to give her an update 1000 washed pt up changed linen, gave morning meds 1200 pt up to the chair for lunch 1400 pt watching tv, gave ice water 1600 fsbs taken, vs taken and charted, spoke with case management for pre discharge updates 1720 pt resting no needs at this time

## 2018-10-07 NOTE — TOC Progression Note (Signed)
Transition of Care [facility] - Progression Note    Patient Details  Name: Gina Hill MRN: [id number] Date of Birth: Feb 07, 1960  Transition of Care [facility]) CM/SW Contact  Ninfa Meeker, RN Phone Number: [phone] (working remotely) 10/07/2018, 5:01 PM  Clinical Narrative:   Case manager spoke with patient's daughter, [name] concerning paatient's need for shortterm rehab. She asked that her mom be faxed to [facility], [place] and [place] and her first choice is [facility]. FL2 completed and PASSAR # obtained. Case manager will continue to follow.  Expected Discharge Plan: [place]    Expected Discharge Plan and Services Expected Discharge Plan: [place]   Discharge Planning Services: CM Consult Post Acute Care Choice: [place] arrangements for the past 2 months: Single Family Home Expected Discharge Date: 10/08/18               DME Arranged: N/A         HH Arranged: PT, OT, Social Work [employer] Agency: [place] Date [place] Agency Contacted: 10/05/18 Time [place]: 1632 Representative spoke with at [place]: [place]) Interventions    Readmission Risk Interventions No flowsheet data found.

## 2018-10-08 LAB — GLUCOSE, CAPILLARY
Glucose-Capillary: 89 mg/dL (ref 70–99)
Glucose-Capillary: 201 mg/dL — ABNORMAL HIGH (ref 70–99)
Glucose-Capillary: 136 mg/dL — ABNORMAL HIGH (ref 70–99)
Glucose-Capillary: 80 mg/dL (ref 70–99)

## 2018-10-08 MED ORDER — SODIUM CHLORIDE 0.9 % IV BOLUS
500.0000 mL | Freq: Once | INTRAVENOUS | Status: AC
  Administered 2018-10-08: 500 mL via INTRAVENOUS

## 2018-10-08 NOTE — Progress Notes (Signed)
BP running [id number] systolic with MAP of 64:  No other SIRS: 97% on RA, no tachycardia, no fever.  1) stopping all the BP meds (amlodipine, metoprolol) 2) will do 500cc NS bolus now to see how she responds 3) patient actually AAOx4 right now (actually significant improvement compared to rest of admission), slightly dizzy.

## 2018-10-08 NOTE — Progress Notes (Addendum)
PROGRESS NOTE  Gina Hill  [id number] DOB: 23-Sep-1960 DOA: 10/01/2018 PCP: Dion Body, MD, PCP in [place] is with [facility].   Brief Narrative: Gina Hill is a 58 y.o. female with a history of T2DM, morbid obesity, HTN, and chronic pain syndrome on chronic opioid medication who was brought to the ED 9/12 by EMS for fevers up to 104F and altered mental status in the setting of URI symptoms and cough for 2-3 days. In the ED the patient was febrile to 102.48F and hypoxic to 82% on room air with bilateral infiltrates on CXR and positive SARS-CoV-2 PCR test.     Assessment & Plan: Principal Problem:   Acute Hypoxic Respiratory failure, acute--- due to COVID-19 Active Problems:   Hypertension   Diabetes ([place])   Acute respiratory disease due to COVID-19 virus   Pneumonia due to COVID-19 virus   Acute hypoxic respiratory failure due to covid-19 pneumonia and possible CAP: Hypoxia improving. Onset of illness on 9/9 put her at risk of decompensation around this time.  - Completed 5 days of remdesivir (9/12 - 9/16) - Completed cefepime x5 days (9/12 - 9/16) - Stop steroids due to clinical improvement and ?contribution to AMS. - Continue airborne, contact precautions. PPE including surgical gown, gloves, cap, shoe covers, and CAPR used during this encounter in a negative pressure room.  - CRP 20 >> 1.0 - Enoxaparin prophylactic dose. - Maintain euvolemia/net negative.  - Avoid NSAIDs - Recommend proning and aggressive use of incentive spirometry.  Acute metabolic encephalopathy: Presentation is most consistent with global insult. With no documented hypotension nor profound hypoxemia, most likely related to prolonged hypoglycemia prior to admission and having 2 butrans patches on. No suggestive signs of PRES. Polypharmacy, possibly psychogenic component with restricted affect also likely contributing. Nonfocal exam. TSH is wnl. RPR NR. CT head without acute findings and minimal  chronic microvascular findings. - Neurology consulted, evaluated patient 9/18. EEG w/o epileptiform discharges.  - Continue off narcotics for now. Narcan prn. - DC steroid. If still confused Mon > check MRI. Otherwise, will likely be able to go home (if not SNF) Mon. - Avoid hypoglycemia. Pt is brittle diabetic, would permit some hyperglycemia. - Decreased sedating medications as below. - Has completed cefepime. Neurology feels this could have contributed.  - Delirium precautions.  Chronic pain syndrome, lumbar stenosis with sciatica, depression:  - Buprenorphine 1mcg/hr patch stopped, MS contin now stopped. Under the care of Dr. Kathi Der as outpatient, has gotten injections. Had RFA performed earlier this year and has reported decline in functional status since that time with planned repeat procedure later this year. - Continue lyrica and effexor. Also on topiramate which is known to cause confusion, stopped.  Elevated d-dimer: Initially >20, rapidly decreased without therapuetic anticoagulation and no longer hypoxic without symptoms of DVT.  - Continue prophylactic lovenox, no further workup currently planned.   T2DM with hyperglycemia: Steroid-induced worsening noted. Prolonged hypoglycemia reported PTA by family concomitant with persistent mental status changes.  - Continue SSI and lantus, added low dose mealtime insulin given persistent postprandial elevation. Fasting CBG at goal. - Holding home medications. HbA1c 6.6% indicates likely too tight of control in frail patient.   Covid-19 gastroenteritis: Improving clinically - Continue supportive care  LFT elevation: Mild. Likely due to viral infection. Ammonia normal.  - Continue monitoring.  Morbid obesity: BMI 35.  - Weight loss recommended long term  Hypokalemia: Improved today but still slightly low. - Supplement again today.   Hypomagnesemia:  - Supplement  as needed to maintain goal of 2.   HTN:  - Continue metoprolol,  hold for MAP <75mmHg or HR <60bpm. - Monitor and adjust regimen as necessary.   Normocytic anemia: Possibly due to acute illness  - Monitor for bleeding.  DVT prophylaxis: Lovenox 0.5mg /kg q24h = 50mg  Code Status: Full Family Communication: Discussing by phone with daughter daily. No answer today. Disposition Plan: Continue work up for encephalopathy. If not improving, could consider MRI, though patient may be improving. Would need updated PT/OT recommendations for disposition.    Consultants:   PT/OT  Neurology  Procedures:   None  Antimicrobials:  Remdesivir 9/12 - 9/16  Vancomycin 9/12  Flagyl 9/12  Cefepime 9/12 - 9/16  Subjective: Admitted to feeling mentally cloudy, but is able to have a more linear conversation today. Still not back to baseline at all. No complaints today.   Objective: Vitals:   10/07/18 2300 10/08/18 0400 10/08/18 0750 10/08/18 1024  BP: 118/72 131/75 130/69 (!) 96/58  Pulse: (!) 53 (!) 55 (!) 56   Resp:  11 12   Temp:  98.3 F (36.8 C) 98 F (36.7 C)   TempSrc:   Oral   SpO2: 97% 97% 96%   Weight:      Height:        Intake/Output Summary (Last 24 hours) at 10/08/2018 1157 Last data filed at 10/08/2018 0500 Gross per 24 hour  Intake 253 ml  Output --  Net 253 ml   Filed Weights   10/01/18 0955 10/01/18 1216  Weight: 107.5 kg 102.1 kg   Gen: 58 y.o. female in no distress Pulm: Nonlabored breathing room air. Clear. CV: Regular rate and rhythm. No murmur, rub, or gallop. No JVD, no dependent edema. GI: Abdomen soft, non-tender, non-distended, with normoactive bowel sounds.  Ext: Warm, no deformities Skin: No new rashes, lesions or ulcers on visualized skin. Scant hyperpigmentation over site of butrans patch on left upper back. Neuro: Alert, oriented to place, situation, and person (improved). No focal neurological deficits. Psych: Judgement and insight appear improved, still impaired. Severity of perseverative speech is much  improved from yesterday. Mood euthymic & affect congruent. Behavior is appropriate.    Data Reviewed: I have personally reviewed following labs and imaging studies  CBC: Recent Labs  Lab 10/02/18 0535 10/03/18 0304 10/04/18 0240 10/05/18 0320 10/06/18 0230  WBC 8.3 9.4 9.9 8.6 8.8  NEUTROABS 7.1 7.8* 8.2* 6.0 5.9  HGB 11.4* 11.6* 11.4* 11.7* 12.3  HCT 35.0* 35.0* 34.5* 35.5* 37.9  MCV 98.6 97.2 97.5 98.9 99.0  PLT 262 305 323 331 [id number]   Basic Metabolic Panel: Recent Labs  Lab 10/03/18 0304 10/04/18 0240 10/05/18 0320 10/06/18 0230 10/07/18 0234  NA 142 142 143 143 142  K 3.3* 3.5 3.6 3.2* 3.4*  CL 109 109 111 108 109  CO2 22 22 24 24 25   GLUCOSE 309* 287* 203* 111* 49*  BUN 23* 31* 30* 24* 26*  CREATININE 0.73 0.77 0.75 0.67 0.60  CALCIUM 10.6* 10.9* 10.9* 11.1* 10.8*  MG  --  1.8  --   --   --    GFR: Estimated Creatinine Clearance: 96.4 mL/min (by C-G formula based on SCr of 0.6 mg/dL). Liver Function Tests: Recent Labs  Lab 10/03/18 0304 10/04/18 0240 10/05/18 0320 10/06/18 0230 10/07/18 0234  AST 35 22 20 31  32  ALT 46* 37 38 46* 53*  ALKPHOS 53 53 52 54 50  BILITOT 0.9 0.6 0.7 0.5 0.8  PROT 7.4  6.7 6.7 7.0 6.4*  ALBUMIN 3.1* 3.0* 3.0* 3.2* 2.9*   No results for input(s): LIPASE, AMYLASE in the last 168 hours. Recent Labs  Lab 10/05/18 0320  AMMONIA 30   Coagulation Profile: No results for input(s): INR, PROTIME in the last 168 hours. Cardiac Enzymes: No results for input(s): CKTOTAL, CKMB, CKMBINDEX, TROPONINI in the last 168 hours. BNP (last 3 results) No results for input(s): PROBNP in the last 8760 hours. HbA1C: Recent Labs    10/06/18 0230  HGBA1C 6.6*   CBG: Recent Labs  Lab 10/07/18 1230 10/07/18 1648 10/07/18 2122 10/08/18 0728 10/08/18 1124  GLUCAP 157* 239* 227* 89 201*   Lipid Profile: No results for input(s): CHOL, HDL, LDLCALC, TRIG, CHOLHDL, LDLDIRECT in the last 72 hours. Thyroid Function Tests: Recent Labs     10/06/18 0230  TSH 0.458   Anemia Panel: Recent Labs    10/06/18 0230  FERRITIN 820*   Urine analysis:    Component Value Date/Time   COLORURINE YELLOW 10/01/2018 0959   APPEARANCEUR HAZY (A) 10/01/2018 0959   LABSPEC 1.035 (H) 10/01/2018 0959   PHURINE 5.0 10/01/2018 0959   GLUCOSEU NEGATIVE 10/01/2018 0959   HGBUR SMALL (A) 10/01/2018 0959   BILIRUBINUR NEGATIVE 10/01/2018 0959   KETONESUR 5 (A) 10/01/2018 0959   PROTEINUR >=300 (A) 10/01/2018 0959   UROBILINOGEN 1.0 05/28/2009 2049   NITRITE NEGATIVE 10/01/2018 0959   LEUKOCYTESUR NEGATIVE 10/01/2018 0959   Recent Results (from the past 240 hour(s))  SARS Coronavirus 2 [facility] [place] order, Performed in [facility] hospital lab) Nasopharyngeal Nasopharyngeal Swab     Status: Abnormal   Collection Time: 10/01/18  9:50 AM   Specimen: Nasopharyngeal Swab  Result Value Ref Range Status   SARS Coronavirus 2 POSITIVE (A) NEGATIVE Final    Comment: RESULT CALLED TO, READ BACK BY AND VERIFIED WITH: MCMANUS @ 1256 ON [id number] BY HENDERSON L. (NOTE) If result is NEGATIVE SARS-CoV-2 target nucleic acids are NOT DETECTED. The SARS-CoV-2 RNA is generally detectable in upper and lower  respiratory specimens during the acute phase of infection. The lowest  concentration of SARS-CoV-2 viral copies this assay can detect is 250  copies / mL. A negative result does not preclude SARS-CoV-2 infection  and should not be used as the sole basis for treatment or other  patient management decisions.  A negative result may occur with  improper specimen collection / handling, submission of specimen other  than nasopharyngeal swab, presence of viral mutation(s) within the  areas targeted by this assay, and inadequate number of viral copies  (<250 copies / mL). A negative result must be combined with clinical  observations, patient history, and epidemiological information. If result is POSITIVE SARS-CoV-2 target nucleic acids are DETEC TED. The  SARS-CoV-2 RNA is generally detectable in upper and lower  respiratory specimens during the acute phase of infection.  Positive  results are indicative of active infection with SARS-CoV-2.  Clinical  correlation with patient history and other diagnostic information is  necessary to determine patient infection status.  Positive results do  not rule out bacterial infection or co-infection with other viruses. If result is PRESUMPTIVE POSTIVE SARS-CoV-2 nucleic acids MAY BE PRESENT.   A presumptive positive result was obtained on the submitted specimen  and confirmed on repeat testing.  While 2019 novel coronavirus  (SARS-CoV-2) nucleic acids may be present in the submitted sample  additional confirmatory testing may be necessary for epidemiological  and / or clinical management purposes  to differentiate between  SARS-CoV-2 and other Sarbecovirus currently known to infect humans.  If clinically indicated additional testing with an alternate test  methodology (LAB7 453) is advised. The SARS-CoV-2 RNA is generally  detectable in upper and lower respiratory specimens during the acute  phase of infection. The expected result is Negative. Fact Sheet for Patients:  StrictlyIdeas.no Fact Sheet for Healthcare Providers: BankingDealers.co.za This test is not yet approved or cleared by the [place] FDA and has been authorized for detection and/or diagnosis of SARS-CoV-2 by FDA under an Emergency Use Authorization (EUA).  This EUA will remain in effect (meaning this test can be used) for the duration of the COVID-19 declaration under Section 564(b)(1) of the Act, 21 U.S.C. section 360bbb-3(b)(1), unless the authorization is terminated or revoked sooner. Performed at [facility], [address], [place], [place] [address]   Urine culture     Status: None   Collection Time: 10/01/18  9:59 AM   Specimen: Urine, Catheterized  Result Value Ref Range  Status   Specimen Description   Final    URINE, CATHETERIZED Performed at [facility], [address], [place], [place] [address]    Special Requests   Final    NONE Performed at [facility], [address], [place], [place] [address]    Culture   Final    NO GROWTH Performed at [facility], [place] [address], [place], [place] [address]    Report Status 10/03/2018 FINAL  Final  Blood Culture (routine x 2)     Status: None   Collection Time: 10/01/18 10:36 AM   Specimen: BLOOD RIGHT HAND  Result Value Ref Range Status   Specimen Description   Final    BLOOD RIGHT HAND BOTTLES DRAWN AEROBIC AND ANAEROBIC   Special Requests Blood Culture adequate volume  Final   Culture   Final    NO GROWTH 5 DAYS Performed at [facility], [address], [place], [place] [address]    Report Status 10/06/2018 FINAL  Final  Blood Culture (routine x 2)     Status: None   Collection Time: 10/01/18 10:36 AM   Specimen: BLOOD LEFT HAND  Result Value Ref Range Status   Specimen Description   Final    BLOOD LEFT HAND BOTTLES DRAWN AEROBIC AND ANAEROBIC   Special Requests Blood Culture adequate volume  Final   Culture   Final    NO GROWTH 5 DAYS Performed at [facility], [address], [place], [place] [address]    Report Status 10/06/2018 FINAL  Final      Radiology Studies: Ct Head Wo Contrast  Result Date: 10/06/2018 CLINICAL DATA:  Altered mental status with diminished upper extremity dexterity EXAM: CT HEAD WITHOUT CONTRAST TECHNIQUE: Contiguous axial images were obtained from the base of the skull through the vertex without intravenous contrast. COMPARISON:  May 28, 2009 FINDINGS: Brain: Ventricles and sulci are within normal limits for age. There is no intracranial mass, hemorrhage, extra-axial fluid collection, or midline shift. There is slight small vessel disease in the centra semiovale bilaterally. Elsewhere brain parenchyma appears unremarkable. No acute infarct is evident. Vascular:  There is no appreciable hyperdense vessel. There is no appreciable vascular calcification. Skull: The bony calvarium appears intact. Sinuses/Orbits: There is mucosal thickening in several ethmoid air cells. Other paranasal sinuses are clear. Orbits appear symmetric bilaterally. Other: Visualized mastoid air cells are clear. IMPRESSION: Slight periventricular small vessel disease. No acute infarct. No mass or hemorrhage. There is mucosal thickening in several ethmoid  air cells. Electronically Signed   By: Lowella Grip III M.D.   On: 10/06/2018 15:07    Scheduled Meds:  amLODipine  10 mg Oral Daily   enoxaparin (LOVENOX) injection  50 mg Subcutaneous Q12H   famotidine  20 mg Oral Daily   folic acid  1 mg Oral Daily   insulin aspart  0-20 Units Subcutaneous TID WC   insulin aspart  3 Units Subcutaneous TID WC   insulin glargine  14 Units Subcutaneous BID   ipratropium  2 puff Inhalation Q6H   metoprolol succinate  50 mg Oral BID   multivitamin with minerals  1 tablet Oral Daily   oxybutynin  10 mg Oral Daily   pregabalin  150 mg Oral QID   sodium chloride flush  3 mL Intravenous Q12H   thiamine  100 mg Oral Daily   venlafaxine XR  37.5 mg Oral QHS   vitamin C  500 mg Oral Daily   zinc sulfate  220 mg Oral Daily   Continuous Infusions:  sodium chloride       LOS: 7 days   Time spent: 35 minutes.  Patrecia Pour, MD [facility] [email or web address] Password [facility] 10/08/2018, 11:57 AM

## 2018-10-08 NOTE — Plan of Care (Signed)
SBP <100 upon morning assessment. Held amlodipine and toprol xl. VS now stable. Pt A&O x3 today which is an improvement from previous shift. No c/o pain. Fall precautions maintained. Call light in reach.

## 2018-10-08 NOTE — Progress Notes (Addendum)
Physical Therapy Treatment Patient Details Name: Gina Hill MRN: [id number] DOB: [birth year] Today's Date: 10/08/2018    History of Present Illness 58 yo female presenting to ED with AMS and fever. Tested COVID positive. PMH including DM, depression, chronic pain syndrome, breast cancer, HTN, morbid obesity, and prior back sx (per pt report).     PT Comments    Pt with odd cognitive presentation.  Repeating herself throughout the session, remembering some things and not others.  When asked day "Friday", month "Friday".  Pt however, does remember having "dropping" spell here.  Pt was able to walk with incredibly slow gait speed and RW ~85' with min guard assist.   I spoke with her daughter at length over the phone, and although they can provide quite a bit of assist, they cannot quite provide 24/7 assist.  Daughter would like closer medical supervision and more rehab available at SNF level (spoke re: [facility] with SW).  I changed my recommendation to SNF as her physical status combined with her cognitive status make her a very high risk for falling and further injuring herself.  PT will continue to follow acutely for safe mobility progression   Follow Up Recommendations  SNF(family is unable to do 24/7 assist)     Equipment Recommendations  None recommended by PT    Recommendations for Other Services   NA     Precautions / Restrictions Precautions Precautions: Fall Precaution Comments: Pt reports "dropping" spells, none today.     Mobility  Bed Mobility Overal bed mobility: Modified Independent             General bed mobility comments: Pt able to get to EOB safely  Transfers Overall transfer level: Needs assistance Equipment used: Rolling walker (2 wheeled) Transfers: Sit to/from Stand Sit to Stand: Min guard         General transfer comment: Min guard assist for safety  Ambulation/Gait Ambulation/Gait assistance: Min guard Gait Distance (Feet): 85  Feet Assistive device: Rolling walker (2 wheeled) Gait Pattern/deviations: Step-through pattern Gait velocity: decr Gait velocity interpretation: <1.8 ft/sec, indicate of risk for recurrent falls General Gait Details: Pt with very slow gait pattern, at times needed help to steer RW around corners.  walking and talking throughout on RA with VSS.           Balance Overall balance assessment: Needs assistance Sitting-balance support: Feet supported;No upper extremity supported Sitting balance-Leahy Scale: Good     Standing balance support: Bilateral upper extremity supported Standing balance-Leahy Scale: Poor Standing balance comment: needs external support from RW.                             Cognition Arousal/Alertness: Awake/alert Behavior During Therapy: WFL for tasks assessed/performed Overall Cognitive Status: Impaired/Different from baseline Area of Impairment: Orientation;Attention;Memory;Following commands;Safety/judgement;Awareness;Problem solving                 Orientation Level: Disoriented to;Time;Situation Current Attention Level: Sustained Memory: Decreased short-term memory Following Commands: Follows one step commands consistently;Follows one step commands with increased time     Problem Solving: Slow processing General Comments: Pt repeats herself frequently throughout the session.  Pt not able to recall grandchildren's names initially, asking about what happened last night (not sure?) and about the cause of the "dropping" spells.  At times will stop talking completely.              Pertinent Vitals/Pain Pain Assessment: No/denies pain  PT Goals (current goals can now be found in the care plan section) Acute Rehab PT Goals Patient Stated Goal: "Go home" Progress towards PT goals: Progressing toward goals    Frequency    Min 3X/week      PT Plan Current plan remains appropriate       AM-PAC PT "6 Clicks"  Mobility   Outcome Measure  Help needed turning from your back to your side while in a flat bed without using bedrails?: A Little Help needed moving from lying on your back to sitting on the side of a flat bed without using bedrails?: A Little Help needed moving to and from a bed to a chair (including a wheelchair)?: A Little Help needed standing up from a chair using your arms (e.g., wheelchair or bedside chair)?: A Little Help needed to walk in hospital room?: A Little Help needed climbing 3-5 steps with a railing? : A Little 6 Click Score: 18    End of Session Equipment Utilized During Treatment: Gait belt Activity Tolerance: Patient limited by fatigue Patient left: in chair;with call bell/phone within reach;with chair alarm set Nurse Communication: Mobility status PT Visit Diagnosis: Unsteadiness on feet (R26.81);Muscle weakness (generalized) (M62.81);Other symptoms and signs involving the nervous system (R29.898);Difficulty in walking, not elsewhere classified (R26.2)     Time: [id number] PT Time Calculation (min) (ACUTE ONLY): 28 min  Charges:  $Gait Training: 23-37 mins                    Gina Hill, PT, DPT  Acute Rehabilitation [phone] pager [phone]) [phone] office  @ [place]: [phone]

## 2018-10-08 NOTE — Procedures (Addendum)
Patient Name: Gina Hill  MRN: [id number]  Epilepsy Attending: Lora Havens  Referring Physician/Provider: Dr Vance Gather Date: 10/07/2018 Duration: 21.62mins  Patient history: 58yo F with ams. EEG to evaluate for seizure.  Level of alertness: awake  AEDs during EEG study: None  Technical aspects: This EEG study was done with scalp electrodes positioned according to the 10-20 International system of electrode placement. Electrical activity was acquired at a sampling rate of 500Hz  and reviewed with a high frequency filter of 70Hz  and a low frequency filter of 1Hz . EEG data were recorded continuously and digitally stored.   DESCRIPTION: EEG showed continuous generalized 2-5hz  theta-delta slowing, maximal bifrontal. No clear posterior dominant rhythm was seen. Hyperventilation and photic stimulation were not performed.  ABNORMALITY: - Continuous slow, generalized  IMPRESSION: This study is suggestive of moderate diffuse encephalopathy, non specific to etiology.  No seizures or epileptiform discharges were seen throughout the recording.  [name] [name]

## 2018-10-08 NOTE — Care Management (Signed)
Barbette Or spoke with Daughter, [name] and she is agreeable with patient to go to [place] [facility] but would prefer her home with Home Health if appropriate on tomorrow. Should she need to go to [facility] can get the patches to [place]. Should patient go home she has been setup with [facility].   Ricki Miller, RN BSN Case Manager [phone]

## 2018-10-08 NOTE — Progress Notes (Signed)
Wasted 2 Butrans 20 mcg per hour patches in Steri Cycle.  Witnessed by Hilbert Corrigan RN.  Earleen Reaper RN

## 2018-10-08 NOTE — TOC Progression Note (Signed)
Transition of Care [facility]) - Progression Note    Patient Details  Name: Gina Hill MRN: [id number] Date of Birth: Jul 09, 1960  Transition of Care [facility]) CM/SW Contact  Krisha Beegle, Francetta Found, LCSW Phone Number: 10/08/2018, 9:48 PM  Clinical Narrative:     Clinical Social Worker spoke with patient daughter who is agreeable to [facility], however if patient made enough gains she was hopeful for return home.  Per PT, patient would still benefit from SNF level of care.  CSW received confirmation from [facility] that they will have a bed available on Monday.  Patient daughter agreeable to provide patient pain management patches from home as requested by the facility.  CSW remains available for support and to facilitate patient discharge needs.  Expected Discharge Plan: [place]    Expected Discharge Plan and Services Expected Discharge Plan: [place]   Discharge Planning Services: CM Consult Post Acute Care Choice: [place] arrangements for the past 2 months: Single Family Home Expected Discharge Date: 10/08/18               DME Arranged: N/A         HH Arranged: PT, OT, Social Work [employer] Agency: [place] Date [place] Agency Contacted: 10/05/18 Time [place]: 1632 Representative spoke with at [place]: [place] ([place]) Interventions    Readmission Risk Interventions No flowsheet data found.

## 2018-10-09 LAB — GLUCOSE, CAPILLARY
Glucose-Capillary: 71 mg/dL (ref 70–99)
Glucose-Capillary: 105 mg/dL — ABNORMAL HIGH (ref 70–99)
Glucose-Capillary: 75 mg/dL (ref 70–99)
Glucose-Capillary: 133 mg/dL — ABNORMAL HIGH (ref 70–99)

## 2018-10-09 MED ORDER — INSULIN ASPART 100 UNIT/ML ~~LOC~~ SOLN
0.0000 [IU] | Freq: Every day | SUBCUTANEOUS | Status: DC
  Administered 2018-10-10 – 2018-10-12 (×2): 2 [IU] via SUBCUTANEOUS

## 2018-10-09 MED ORDER — SODIUM CHLORIDE 0.9 % IV BOLUS
500.0000 mL | Freq: Once | INTRAVENOUS | Status: AC
  Administered 2018-10-09: 500 mL via INTRAVENOUS

## 2018-10-09 MED ORDER — INSULIN GLARGINE 100 UNIT/ML ~~LOC~~ SOLN
14.0000 [IU] | Freq: Every day | SUBCUTANEOUS | Status: DC
  Administered 2018-10-09 – 2018-10-12 (×4): 14 [IU] via SUBCUTANEOUS
  Filled 2018-10-09 (×5): qty 0.14

## 2018-10-09 MED ORDER — INSULIN ASPART 100 UNIT/ML ~~LOC~~ SOLN
0.0000 [IU] | Freq: Three times a day (TID) | SUBCUTANEOUS | Status: DC
  Administered 2018-10-10: 2 [IU] via SUBCUTANEOUS
  Administered 2018-10-10: 3 [IU] via SUBCUTANEOUS
  Administered 2018-10-11: 2 [IU] via SUBCUTANEOUS
  Administered 2018-10-11: 1 [IU] via SUBCUTANEOUS
  Administered 2018-10-11: 2 [IU] via SUBCUTANEOUS
  Administered 2018-10-12: 1 [IU] via SUBCUTANEOUS
  Administered 2018-10-12 – 2018-10-13 (×3): 2 [IU] via SUBCUTANEOUS

## 2018-10-09 NOTE — Progress Notes (Addendum)
PROGRESS NOTE  Gina Hill  [id number] DOB: 01-07-1961 DOA: 10/01/2018 PCP: Dion Body, MD, PCP in [place] is with [facility].   Brief Narrative: Gina Hill is a 58 y.o. female with a history of T2DM, morbid obesity, HTN, and chronic pain syndrome on chronic opioid medication who was brought to the ED 9/12 by EMS for fevers up to 104F and altered mental status in the setting of URI symptoms and cough for 2-3 days. In the ED the patient was febrile to 102.58F and hypoxic to 82% on room air with bilateral infiltrates on CXR and positive SARS-CoV-2 PCR test.     Assessment & Plan: Principal Problem:   Acute Hypoxic Respiratory failure, acute--- due to COVID-19 Active Problems:   Hypertension   Diabetes ([place])   Acute respiratory disease due to COVID-19 virus   Pneumonia due to COVID-19 virus   Acute hypoxic respiratory failure due to covid-19 pneumonia and possible CAP: Hypoxia improving. Onset of illness on 9/9 put her at risk of decompensation around this time.  - Completed 5 days of remdesivir (9/12 - 9/16) - Completed cefepime x5 days (9/12 - 9/16) - Completed 7 days of steroids.  - Continue airborne, contact precautions. PPE including surgical gown, gloves, cap, shoe covers, and CAPR used during this encounter in a negative pressure room.  - CRP 20 >> 1.0 - Avoid NSAIDs  Acute metabolic encephalopathy: Presentation is most consistent with global insult. With no documented hypotension nor profound hypoxemia, most likely related to prolonged hypoglycemia prior to admission and having 2 butrans patches on. No suggestive signs of PRES. Polypharmacy, possibly psychogenic component with restricted affect also likely contributing. Nonfocal exam. TSH is wnl. RPR NR. CT head without acute findings and minimal chronic microvascular findings. - Significant improvement over the past 48 hours in setting of stopping narcotics, steroids, topamax and resolution of severe hypoglycemia.   - Neurology consulted, evaluated patient 9/18. EEG w/o epileptiform discharges.  - Continue off narcotics for now. Narcan prn. - DC steroid.  - Avoid hypoglycemia. Pt is brittle diabetic, would permit some hyperglycemia. - Has completed cefepime. Neurology feels this could have contributed.  - Delirium precautions.  Chronic pain syndrome, lumbar stenosis with sciatica, depression:  - Buprenorphine 45mcg/hr patch stopped, MS contin now stopped. Under the care of Dr. Kathi Der as outpatient, has gotten injections. Had RFA performed earlier this year and has reported decline in functional status since that time with planned repeat procedure later this year. - Continue lyrica and effexor. Also on topiramate which is known to cause confusion and was stopped.  Elevated d-dimer: Initially >20, rapidly decreased without therapuetic anticoagulation and no longer hypoxic without symptoms of DVT.  - Continue prophylactic lovenox, no further workup currently planned.   T2DM with hyperglycemia and hypoglycemia: Steroid-induced worsening noted and improved/decreased insulin requirement with coming off. Prolonged hypoglycemia reported PTA by family. - Continue SSI and lantus, stop mealtime insulin, decrease intensity to sensitive due to hypoglycemia and steroid discontinuation. Will hold AM dose of long acting insulin and monitor to see if PM dose is still needed.   - Holding home medications. HbA1c 6.6% indicates likely too tight of control in frail patient.   Covid-19 gastroenteritis: Improving clinically - Continue supportive care  LFT elevation: Mild. Likely due to viral infection. Ammonia normal.  - Continue monitoring.  Morbid obesity: BMI 35.  - Weight loss recommended long term  Hypokalemia: Improved today but still slightly low. - Supplement again today.   Hypomagnesemia:  - Supplement as  needed to maintain goal of 2.   HTN: Hold metoprolol, norvasc w/hypotension overnight. No tachycardia  or other evidence of dehydration/sepsis/blood loss, etc. This was in the setting of stopping steroids, though was only given for 7 days so doubt adrenal insufficiency.  Hypotension/hypglycemia: No abdominal pain or typical metabolic changes of adrenal insufficiency.  - Will check AM cortisol and/or ACTH stim test.   Normocytic anemia: Possibly due to acute illness  - Monitor for bleeding. Recheck CBC due to low blood pressure which has resolved.  DVT prophylaxis: Lovenox 0.5mg /kg q24h = 50mg  Code Status: Full Family Communication: Discussing by phone with daughter daily. Did speak with her yesterday.  Disposition Plan: Given improvement in encephalopathy and stable respiratory status having completed treatment for covid, she is stable for discharge as long as blood pressure remains wnl and blood sugar remains not severely low. Planning [facility] to [facility] 9/21. Checking with CM to see if she needs repeat covid testing.  Consultants:   PT/OT  Neurology  Procedures:   None  Antimicrobials:  Remdesivir 9/12 - 9/16  Vancomycin 9/12  Flagyl 9/12  Cefepime 9/12 - 9/16  Subjective: She is much more mentally clear this morning, says she slept ok. Did have low blood pressure overnight improved with IV fluids and is planning on eating well today. No leg/back pain reported currently. Denies any shortness of breath. Will to go to rehab tomorrow as she walked very slowly with assistance yesterday evening. She's able to confirm all the details of her PCP in [facility], treatment history of pain w/topamax, lyrica, butrans patches, her doctor's names and locations.   Objective: Vitals:   10/09/18 0400 10/09/18 0500 10/09/18 0600 10/09/18 0734  BP: (!) 82/61 (!) 75/52 99/64 98/65   Pulse: 70 73 66 71  Resp: 11 13 12 12   Temp: 98.8 F (37.1 C)   98.6 F (37 C)  TempSrc: Oral   Oral  SpO2: 96% 92% 95% 95%  Weight:      Height:        Intake/Output Summary (Last 24 hours) at 10/09/2018 [id number]  Last data filed at 10/09/2018 0300 Gross per 24 hour  Intake 740 ml  Output 600 ml  Net 140 ml   Filed Weights   10/01/18 0955 10/01/18 1216  Weight: 107.5 kg 102.1 kg   Gen: 58 y.o. female in no distress Pulm: Nonlabored breathing room air. Clear. CV: Regular rate and rhythm. No murmur, rub, or gallop. No JVD, no dependent edema. GI: Abdomen soft, non-tender, non-distended, with normoactive bowel sounds.  Ext: Warm, no deformities Skin: No new rashes, lesions or ulcers on visualized skin. Neuro: Alert and oriented. No focal neurological deficits. Psych: Judgement and insight appear much improved. Mood euthymic & affect congruent. Behavior is appropriate.    Data Reviewed: I have personally reviewed following labs and imaging studies  CBC: Recent Labs  Lab 10/03/18 0304 10/04/18 0240 10/05/18 0320 10/06/18 0230  WBC 9.4 9.9 8.6 8.8  NEUTROABS 7.8* 8.2* 6.0 5.9  HGB 11.6* 11.4* 11.7* 12.3  HCT 35.0* 34.5* 35.5* 37.9  MCV 97.2 97.5 98.9 99.0  PLT 305 323 331 [id number]   Basic Metabolic Panel: Recent Labs  Lab 10/03/18 0304 10/04/18 0240 10/05/18 0320 10/06/18 0230 10/07/18 0234  NA 142 142 143 143 142  K 3.3* 3.5 3.6 3.2* 3.4*  CL 109 109 111 108 109  CO2 22 22 24 24 25   GLUCOSE 309* 287* 203* 111* 49*  BUN 23* 31* 30* 24* 26*  CREATININE  0.73 0.77 0.75 0.67 0.60  CALCIUM 10.6* 10.9* 10.9* 11.1* 10.8*  MG  --  1.8  --   --   --    GFR: Estimated Creatinine Clearance: 96.4 mL/min (by C-G formula based on SCr of 0.6 mg/dL). Liver Function Tests: Recent Labs  Lab 10/03/18 0304 10/04/18 0240 10/05/18 0320 10/06/18 0230 10/07/18 0234  AST 35 22 20 31  32  ALT 46* 37 38 46* 53*  ALKPHOS 53 53 52 54 50  BILITOT 0.9 0.6 0.7 0.5 0.8  PROT 7.4 6.7 6.7 7.0 6.4*  ALBUMIN 3.1* 3.0* 3.0* 3.2* 2.9*   No results for input(s): LIPASE, AMYLASE in the last 168 hours. Recent Labs  Lab 10/05/18 0320  AMMONIA 30   Coagulation Profile: No results for input(s): INR,  PROTIME in the last 168 hours. Cardiac Enzymes: No results for input(s): CKTOTAL, CKMB, CKMBINDEX, TROPONINI in the last 168 hours. BNP (last 3 results) No results for input(s): PROBNP in the last 8760 hours. HbA1C: No results for input(s): HGBA1C in the last 72 hours. CBG: Recent Labs  Lab 10/08/18 1124 10/08/18 1642 10/08/18 2117 10/09/18 0647 10/09/18 0734  GLUCAP 201* 136* 80 71 105*   Lipid Profile: No results for input(s): CHOL, HDL, LDLCALC, TRIG, CHOLHDL, LDLDIRECT in the last 72 hours. Thyroid Function Tests: No results for input(s): TSH, T4TOTAL, FREET4, T3FREE, THYROIDAB in the last 72 hours. Anemia Panel: No results for input(s): VITAMINB12, FOLATE, FERRITIN, TIBC, IRON, RETICCTPCT in the last 72 hours. Urine analysis:    Component Value Date/Time   COLORURINE YELLOW 10/01/2018 0959   APPEARANCEUR HAZY (A) 10/01/2018 0959   LABSPEC 1.035 (H) 10/01/2018 0959   PHURINE 5.0 10/01/2018 0959   GLUCOSEU NEGATIVE 10/01/2018 0959   HGBUR SMALL (A) 10/01/2018 0959   BILIRUBINUR NEGATIVE 10/01/2018 0959   KETONESUR 5 (A) 10/01/2018 0959   PROTEINUR >=300 (A) 10/01/2018 0959   UROBILINOGEN 1.0 05/28/2009 2049   NITRITE NEGATIVE 10/01/2018 0959   LEUKOCYTESUR NEGATIVE 10/01/2018 0959   Recent Results (from the past 240 hour(s))  SARS Coronavirus 2 [facility] order, Performed in [facility] hospital lab) Nasopharyngeal Nasopharyngeal Swab     Status: Abnormal   Collection Time: 10/01/18  9:50 AM   Specimen: Nasopharyngeal Swab  Result Value Ref Range Status   SARS Coronavirus 2 POSITIVE (A) NEGATIVE Final    Comment: RESULT CALLED TO, READ BACK BY AND VERIFIED WITH: MCMANUS @ 1256 ON [id number] BY HENDERSON L. (NOTE) If result is NEGATIVE SARS-CoV-2 target nucleic acids are NOT DETECTED. The SARS-CoV-2 RNA is generally detectable in upper and lower  respiratory specimens during the acute phase of infection. The lowest  concentration of SARS-CoV-2 viral copies this  assay can detect is 250  copies / mL. A negative result does not preclude SARS-CoV-2 infection  and should not be used as the sole basis for treatment or other  patient management decisions.  A negative result may occur with  improper specimen collection / handling, submission of specimen other  than nasopharyngeal swab, presence of viral mutation(s) within the  areas targeted by this assay, and inadequate number of viral copies  (<250 copies / mL). A negative result must be combined with clinical  observations, patient history, and epidemiological information. If result is POSITIVE SARS-CoV-2 target nucleic acids are DETEC TED. The SARS-CoV-2 RNA is generally detectable in upper and lower  respiratory specimens during the acute phase of infection.  Positive  results are indicative of active infection with SARS-CoV-2.  Clinical  correlation  with patient history and other diagnostic information is  necessary to determine patient infection status.  Positive results do  not rule out bacterial infection or co-infection with other viruses. If result is PRESUMPTIVE POSTIVE SARS-CoV-2 nucleic acids MAY BE PRESENT.   A presumptive positive result was obtained on the submitted specimen  and confirmed on repeat testing.  While 2019 novel coronavirus  (SARS-CoV-2) nucleic acids may be present in the submitted sample  additional confirmatory testing may be necessary for epidemiological  and / or clinical management purposes  to differentiate between  SARS-CoV-2 and other Sarbecovirus currently known to infect humans.  If clinically indicated additional testing with an alternate test  methodology (LAB7 [id number]) is advised. The SARS-CoV-2 RNA is generally  detectable in upper and lower respiratory specimens during the acute  phase of infection. The expected result is Negative. Fact Sheet for Patients:  StrictlyIdeas.no Fact Sheet for Healthcare Providers:  BankingDealers.co.za This test is not yet approved or cleared by the [place] FDA and has been authorized for detection and/or diagnosis of SARS-CoV-2 by FDA under an Emergency Use Authorization (EUA).  This EUA will remain in effect (meaning this test can be used) for the duration of the COVID-19 declaration under Section 564(b)(1) of the Act, 21 U.S.C. section 360bbb-3(b)(1), unless the authorization is terminated or revoked sooner. Performed at [facility], [address] [place], [place] [address]   Urine culture     Status: None   Collection Time: 10/01/18  9:59 AM   Specimen: Urine, Catheterized  Result Value Ref Range Status   Specimen Description   Final    URINE, CATHETERIZED Performed at [facility], [address] [place], [place] [address]    Special Requests   Final    NONE Performed at [facility], [address], [place], [place] [address]    Culture   Final    NO GROWTH Performed at [facility], [place] [address], [place], [place] [address]    Report Status 10/03/2018 FINAL  Final  Blood Culture (routine x 2)     Status: None   Collection Time: 10/01/18 10:36 AM   Specimen: BLOOD RIGHT HAND  Result Value Ref Range Status   Specimen Description   Final    BLOOD RIGHT HAND BOTTLES DRAWN AEROBIC AND ANAEROBIC   Special Requests Blood Culture adequate volume  Final   Culture   Final    NO GROWTH 5 DAYS Performed at [facility], [address], [place], [place] [address]    Report Status 10/06/2018 FINAL  Final  Blood Culture (routine x 2)     Status: None   Collection Time: 10/01/18 10:36 AM   Specimen: BLOOD LEFT HAND  Result Value Ref Range Status   Specimen Description   Final    BLOOD LEFT HAND BOTTLES DRAWN AEROBIC AND ANAEROBIC   Special Requests Blood Culture adequate volume  Final   Culture   Final    NO GROWTH 5 DAYS Performed at [facility], [address], [place], [place] [address]    Report Status 10/06/2018  FINAL  Final      Radiology Studies: No results found.  Scheduled Meds: . enoxaparin (LOVENOX) injection  50 mg Subcutaneous Q12H  . famotidine  20 mg Oral Daily  . folic acid  1 mg Oral Daily  . insulin aspart  0-20 Units Subcutaneous TID WC  . insulin aspart  3 Units Subcutaneous TID WC  . insulin glargine  14 Units Subcutaneous  QHS  . ipratropium  2 puff Inhalation Q6H  . multivitamin with minerals  1 tablet Oral Daily  . oxybutynin  10 mg Oral Daily  . pregabalin  150 mg Oral QID  . sodium chloride flush  3 mL Intravenous Q12H  . thiamine  100 mg Oral Daily  . venlafaxine XR  37.5 mg Oral QHS  . vitamin C  500 mg Oral Daily  . zinc sulfate  220 mg Oral Daily   Continuous Infusions: . sodium chloride       LOS: 8 days   Time spent: 35 minutes.  Patrecia Pour, MD [facility] [email or web address] Password [email or web address] 10/09/2018, 9:07 AM

## 2018-10-09 NOTE — Progress Notes (Signed)
MEWS 2: Low BP - MD aware.  Bolus ordered.  HTN meds dc'd.

## 2018-10-09 NOTE — Progress Notes (Signed)
   Vital Signs MEWS/VS Documentation      10/08/2018 2024 10/08/2018 2045 10/08/2018 2242 10/09/2018 0400   MEWS Score:  2  2  2  2    MEWS Score Color:  Yellow  Yellow  Yellow  Yellow   Resp:  11  -  13  11   Pulse:  77  -  68  -   BP:  (!) 82/54  -  98/62  (!) 82/61   Temp:  -  -  -  98.8 F (37.1 C)   O2 Device:  -  -  Room Air  Room Air   Level of Consciousness:  -  Alert  -  -       MD aware.  Orders received.    Mateo Flow, Sabiha Sura K 10/09/2018,5:26 AM

## 2018-10-09 NOTE — TOC Progression Note (Signed)
Transition of Care [facility]) - Progression Note    Patient Details  Name: Gina Hill MRN: [id number] Date of Birth: 06/02/1960  [facility] of [facility]) CM/SW Contact  Cobi Aldape, Francetta Found, LCSW Phone Number: 10/09/2018, 10:07 PM  Clinical Narrative:     Clinical Social Worker spoke with patient daughter over the phone to offer support and discuss patient plan at discharge.  Patient daughter is in agreement with patient discharge to [facility].  Facility has a bed available on Monday and patient daughter plans to provide pain management patches to facility.  Will need updated COVID test prior to discharge.  CSW remains available for support and to facilitate patient discharge needs once medically stable.    Expected Discharge Plan: [place]    Expected Discharge Plan and Services Expected Discharge Plan: [place]   Discharge Planning Services: CM Consult Post Acute Care Choice: [place] arrangements for the past 2 months: Single Family Home Expected Discharge Date: 10/08/18               DME Arranged: N/A         HH Arranged: PT, OT, Social Work [employer] Agency: [place] Date [place] Agency Contacted: 10/05/18 Time [place]: 1632 Representative spoke with at [place]: [place] ([place]) Interventions    Readmission Risk Interventions No flowsheet data found.

## 2018-10-09 NOTE — Progress Notes (Signed)
After the 500cc NS bolus earlier in evening (~8pm), SBP improved to 98 for a while.  Now BP back down to 82/61 again.  Will do another 500cc bolus now.

## 2018-10-09 NOTE — Plan of Care (Signed)
  Problem: Education: Goal: Knowledge of risk factors and measures for prevention of condition will improve Outcome: Progressing   Problem: Coping: Goal: Psychosocial and spiritual needs will be supported Outcome: Progressing   Problem: Respiratory: Goal: Will maintain a patent airway Outcome: Progressing Goal: Complications related to the disease process, condition or treatment will be avoided or minimized Outcome: Progressing   Problem: Education: Goal: Knowledge of General Education information will improve Description: Including pain rating scale, medication(s)/side effects and non-pharmacologic comfort measures Outcome: Progressing   Problem: Health Behavior/Discharge Planning: Goal: Ability to manage health-related needs will improve Outcome: Progressing   Problem: Clinical Measurements: Goal: Ability to maintain clinical measurements within normal limits will improve Outcome: Progressing Goal: Will remain free from infection Outcome: Progressing Goal: Diagnostic test results will improve Outcome: Progressing Goal: Respiratory complications will improve Outcome: Progressing Goal: Cardiovascular complication will be avoided Outcome: Progressing   Problem: Activity: Goal: Risk for activity intolerance will decrease Outcome: Progressing   Problem: Nutrition: Goal: Adequate nutrition will be maintained Outcome: Progressing   Problem: Coping: Goal: Level of anxiety will decrease Outcome: Progressing   Problem: Elimination: Goal: Will not experience complications related to bowel motility Outcome: Progressing Goal: Will not experience complications related to urinary retention Outcome: Progressing   Problem: Pain Managment: Goal: General experience of comfort will improve Outcome: Progressing   Problem: Safety: Goal: Ability to remain free from injury will improve Outcome: Progressing   Problem: Skin Integrity: Goal: Risk for impaired skin integrity will  decrease Outcome: Progressing  No acute changes this shift. Now A&O x4. VSS. Fall precautions maintained. Call light in reach.

## 2018-10-10 LAB — COMPREHENSIVE METABOLIC PANEL
ALT: 53 U/L — ABNORMAL HIGH (ref 0–44)
AST: 27 U/L (ref 15–41)
Albumin: 2.8 g/dL — ABNORMAL LOW (ref 3.5–5.0)
Alkaline Phosphatase: 50 U/L (ref 38–126)
Anion gap: 7 (ref 5–15)
BUN: 23 mg/dL — ABNORMAL HIGH (ref 6–20)
CO2: 27 mmol/L (ref 22–32)
Calcium: 9.9 mg/dL (ref 8.9–10.3)
Chloride: 106 mmol/L (ref 98–111)
Creatinine, Ser: 0.66 mg/dL (ref 0.44–1.00)
GFR calc Af Amer: >60 mL/min (ref >60)
GFR calc non Af Amer: >60 mL/min (ref >60)
Glucose, Bld: 98 mg/dL (ref 70–99)
Potassium: 3.7 mmol/L (ref 3.5–5.1)
Sodium: 140 mmol/L (ref 135–145)
Total Bilirubin: 0.7 mg/dL (ref 0.3–1.2)
Total Protein: 5.7 g/dL — ABNORMAL LOW (ref 6.5–8.1)

## 2018-10-10 LAB — GLUCOSE, CAPILLARY
Glucose-Capillary: 101 mg/dL — ABNORMAL HIGH (ref 70–99)
Glucose-Capillary: 93 mg/dL (ref 70–99)
Glucose-Capillary: 180 mg/dL — ABNORMAL HIGH (ref 70–99)
Glucose-Capillary: 210 mg/dL — ABNORMAL HIGH (ref 70–99)
Glucose-Capillary: 223 mg/dL — ABNORMAL HIGH (ref 70–99)

## 2018-10-10 LAB — CBC WITH DIFFERENTIAL/PLATELET
Abs Immature Granulocytes: 0.1 10*3/uL — ABNORMAL HIGH (ref 0.00–0.07)
Basophils Absolute: 0 10*3/uL (ref 0.0–0.1)
Basophils Relative: 0 %
Eosinophils Absolute: 0.1 10*3/uL (ref 0.0–0.5)
Eosinophils Relative: 1 %
HCT: 33 % — ABNORMAL LOW (ref 36.0–46.0)
Hemoglobin: 10.5 g/dL — ABNORMAL LOW (ref 12.0–15.0)
Immature Granulocytes: 1 %
Lymphocytes Relative: 43 %
Lymphs Abs: 3 10*3/uL (ref 0.7–4.0)
MCH: 32.9 pg (ref 26.0–34.0)
MCHC: 31.8 g/dL (ref 30.0–36.0)
MCV: 103.4 fL — ABNORMAL HIGH (ref 80.0–100.0)
Monocytes Absolute: 0.7 10*3/uL (ref 0.1–1.0)
Monocytes Relative: 10 %
Neutro Abs: 3.2 10*3/uL (ref 1.7–7.7)
Neutrophils Relative %: 45 %
Platelets: 302 10*3/uL (ref 150–400)
RBC: 3.19 MIL/uL — ABNORMAL LOW (ref 3.87–5.11)
RDW: 13.3 % (ref 11.5–15.5)
WBC: 7 10*3/uL (ref 4.0–10.5)
nRBC: 0 % (ref 0.0–0.2)

## 2018-10-10 LAB — CORTISOL-AM, BLOOD: Cortisol - AM: 4.4 ug/dL — ABNORMAL LOW (ref 6.7–22.6)

## 2018-10-10 MED ORDER — PREDNISONE 20 MG PO TABS: ORAL_TABLET | ORAL | 0 refills | Status: DC

## 2018-10-10 MED ORDER — HYDROCORTISONE 10 MG PO TABS
10.0000 mg | ORAL_TABLET | Freq: Every day | ORAL | Status: DC
  Administered 2018-10-10 – 2018-10-12 (×3): 10 mg via ORAL
  Filled 2018-10-10 (×5): qty 1

## 2018-10-10 NOTE — Care Management Important Message (Signed)
Important Message  Patient Details  Name: Gina Hill MRN: [id number] Date of Birth: August 31, 1960   Medicare Important Message Given:  Yes - Important Message mailed due to current National Emergency   Verbal consent obtained due to current National Emergency  Relationship to patient: Child Contact Name: Sheran Fava Call Date: 10/10/18  Time: [id number] Phone: [phone] Outcome: Spoke with contact Important Message mailed to: Other (must enter comment)(Daughter stated another copy is not necessary)      [place] 10/10/2018, 3:31 PM

## 2018-10-10 NOTE — Discharge Summary (Addendum)
Physician Discharge Summary  Gina Hill [id number] DOB: Oct 03, 1960 DOA: 10/01/2018  PCP: Dion Body, MD  Admit date: 10/01/2018 Discharge date: 10/11/2018  Admitted From: Home Disposition: SNF   Recommendations for Outpatient Follow-up:  1. Follow up with PCP in 1-2 weeks 2. Please obtain CMP/CBC in one week 3. Follow up with oncology per routine 4. Follow up with pain management per routine.  5. Note following changes to regimen: Stop topamax due to confusion, stop antihypoglycemic agents due to hypoglycemia. Stop metoprolol due to hypotension. Note taper of hydrocortisone for possible relative adrenal insufficiency. Very likely will need to restart medications based on clinical trajectory. 6. Psychiatry and/or psychotherapy recommended as an outpatient to address depression. Patient would benefit from a provider with a background and life experiences similar to the patient. An African-American female would be preferred.   Home Health: N/A Equipment/Devices: Per SNF Discharge Condition: Stable, improved CODE STATUS: Full Diet recommendation: Heart healthy, carb-modified  Brief/Interim Summary: Gina Hill is a 58 y.o. female with a history of breast CA, T2DM, morbid obesity, HTN, and chronic pain syndrome on chronic opioid medication who was brought to the ED 9/12 by EMS for fevers up to 104F and altered mental status in the setting of URI symptoms and cough for 2-3 days. In the ED the patient was febrile to 102.23F and hypoxic to 82% on room air with bilateral infiltrates on CXR and positive SARS-CoV-2 PCR test. Remdesivir and steroids were started as was cefepime for possibility of bacterial PNA as well. With treatment hypoxia, shortness of breath, and fevers have resolved. The patient remained encephalopathic, with a work up that did not reveal significant/specific findings on neuroimaging or EEG. Butrans patch was removed and topamax stopped with subsequent return to  cognitive baseline. Due to severity of chronic pain, the butrans patch is planned to be restarted and followed up by pain management as an outpatient. Due to recurrent hypoglycemia and some soft blood pressures, cortisol was checked, found to be low. The patient will be placed on a more prolonged steroid taper than usually indicated for covid-19 for this reason. PT and OT have worked regularly with the patient and recommend short term rehabilitation at a facility prior to returning home.   Discharge Diagnoses:  Principal Problem:   Acute Hypoxic Respiratory failure, acute--- due to COVID-19 Active Problems:   Hypertension   Diabetes ([place])   Acute respiratory disease due to COVID-19 virus   Pneumonia due to COVID-19 virus  Acute hypoxic respiratory failure due to covid-19 pneumonia and possible CAP: Hypoxia resolved. CRP 20 >> 1.0 - Completed 5 days of remdesivir (9/12 - 9/16) - Completed cefepime x5 days (9/12 - 9/16) - Completed 7 days of steroids and developed low blood pressure. Cortisol down at <5 on AM check. Restarted and will taper steroids as below. - Continue isolation per CDC guidelines. - Avoid NSAIDs  Acute metabolic encephalopathy: Presentation is most consistent with global insult. With no documented hypotension nor profound hypoxemia, most likely related to prolonged hypoglycemia prior to admission and having 2 butrans patches on. No suggestive signs of PRES. Polypharmacy, possibly psychogenic component with restricted affect also possibly contributing. Nonfocal exam. TSH is wnl. RPR NR. CT head without acute findings and minimal chronic microvascular findings. EEG w/o epileptiform discharges.  - Significant improvement over the past 48-72 hours in setting of stopping narcotics, topamax and resolution of severe hypoglycemia.  - Neurology consulted, evaluated patient 9/18, no further recommendations.  - Plan to restart chronic butrans patch under  close supervision at SNF.  -  Steroid DC'ed though with possible adrenal insufficiency, will continue with taper as above. - Avoid hypoglycemia. Pt is brittle diabetic, would permit some hyperglycemia. - Has completed cefepime. Neurology feels this could have contributed.  - Delirium precautions.  Chronic pain syndrome, lumbar stenosis with sciatica, depression:  - Buprenorphine 28mcg/hr patch stopped and to be restarted. Under the care of Dr. Kathi Der as outpatient, has gotten injections. Had RFA performed earlier this year and has reported decline in functional status since that time with planned repeat procedure later this year. - Continue lyrica and effexor. Also on topiramate which is known to cause confusion and was stopped.  Elevated d-dimer: Initially >20, rapidly decreased without therapuetic anticoagulation and no longer hypoxic without symptoms of DVT.  - No further workup currently planned.   T2DM with hyperglycemia and hypoglycemia: Steroid-induced worsening noted and improved/decreased insulin requirement with coming off. Prolonged hypoglycemia reported PTA by family. - Holding home medications. HbA1c 6.6% indicates very tight control. Will allow some permissive hyperglycemia temporarily to avoid hypoglycemia. Continue follow up with outpatient providers, Dr. Dwyane Dee.  Covid-19 gastroenteritis: Resolved.  LFT elevation: Mild. Likely due to viral infection. Ammonia normal.  - Continue monitoring.  Morbid obesity: BMI 35.  - Weight loss recommended long term  Hypokalemia: Improved with supplementation  Hypomagnesemia:  - Supplement as needed  HTN: Hold metoprolol for now, monitor for need to restart. Relative adrenal insufficiency: Cortisol checked due to hypotension/hypglycemia and found to be low. No abdominal pain or typical metabolic changes of adrenal insufficiency.  - Steroid taper.   Normocytic anemia: Possibly due to acute illness. Hgb 11.4 on admission, stable at 10.5 without  evidence of bleeding. No symptoms of bleeding.  Discharge Instructions  Allergies as of 10/11/2018      Reactions   Gadolinium Derivatives Other (See Comments)   Only PO contrast causes confusion and diarrhea   Iodine Diarrhea   Medroxyprogesterone Nausea And Vomiting, Other (See Comments)   Does not want to take hormones   Tape Other (See Comments)   Tears skin - paper tape OK   Codeine Nausea And Vomiting      Medication List    STOP taking these medications   glipiZIDE 10 MG tablet Commonly known as: GLUCOTROL   levofloxacin 750 MG tablet Commonly known as: LEVAQUIN   metoprolol succinate 50 MG 24 hr tablet Commonly known as: TOPROL-XL   topiramate 100 MG tablet Commonly known as: TOPAMAX     TAKE these medications   benzonatate 100 MG capsule Commonly known as: TESSALON Take 100 mg by mouth 3 (three) times daily as needed for cough.   Butrans 20 MCG/HR Ptwk patch Generic drug: buprenorphine Place 1 patch onto the skin once a week.   diclofenac sodium 1 % Gel Commonly known as: VOLTAREN Apply 2 g topically 2 (two) times daily as needed (to painful joint).   fluticasone 50 MCG/ACT nasal spray Commonly known as: FLONASE 2 sprays See admin instructions.   hydrocortisone 5 MG tablet Commonly known as: CORTEF Take 1 tablet (5 mg total) by mouth 2 (two) times daily for 3 days, THEN 1 tablet (5 mg total) daily for 3 days, THEN 0.5 tablets (2.5 mg total) daily for 4 days. Start taking on: October 11, 2018   MULTIVITAMIN PO Take 1 tablet by mouth daily.   omeprazole 20 MG capsule Commonly known as: PRILOSEC Take 40 mg by mouth daily.   oxybutynin 10 MG 24 hr tablet Commonly known  as: DITROPAN-XL Take 10 mg by mouth daily.   pregabalin 150 MG capsule Commonly known as: LYRICA Take 150 mg by mouth 4 (four) times daily.   venlafaxine XR 37.5 MG 24 hr capsule Commonly known as: EFFEXOR-XR Take 37.5 mg by mouth at bedtime.      Follow-up Information     [facility] Follow up.   Specialty: [place] Why: A representative from [facility] will contact you to arrange start date and time for your therapy. Contact information: [place] [address] [phone]        Dion Body, MD. Schedule an appointment as soon as possible for a visit in 1 week(s).   Specialty: Family Medicine Contact information: [place] [address] [phone]        Shawna Clamp, MD Follow up.   Specialty: Family Medicine Contact information: [id number] [place] [address] [phone]        Jodi Geralds, MD .   Specialty: Pain Medicine Contact information: [address] [place] [address] [phone]          Allergies  Allergen Reactions  . Gadolinium Derivatives Other (See Comments)    Only PO contrast causes confusion and diarrhea  . Iodine Diarrhea  . Medroxyprogesterone Nausea And Vomiting and Other (See Comments)    Does not want to take hormones   . Tape Other (See Comments)    Tears skin - paper tape OK  . Codeine Nausea And Vomiting    Consultations:  Neurology  Procedures/Studies: Ct Head Wo Contrast  Result Date: 10/06/2018 CLINICAL DATA:  Altered mental status with diminished upper extremity dexterity EXAM: CT HEAD WITHOUT CONTRAST TECHNIQUE: Contiguous axial images were obtained from the base of the skull through the vertex without intravenous contrast. COMPARISON:  May 28, 2009 FINDINGS: Brain: Ventricles and sulci are within normal limits for age. There is no intracranial mass, hemorrhage, extra-axial fluid collection, or midline shift. There is slight small vessel disease in the centra semiovale bilaterally. Elsewhere brain parenchyma appears unremarkable. No acute infarct is evident. Vascular: There is no appreciable hyperdense vessel. There is no appreciable vascular calcification. Skull:  The bony calvarium appears intact. Sinuses/Orbits: There is mucosal thickening in several ethmoid air cells. Other paranasal sinuses are clear. Orbits appear symmetric bilaterally. Other: Visualized mastoid air cells are clear. IMPRESSION: Slight periventricular small vessel disease. No acute infarct. No mass or hemorrhage. There is mucosal thickening in several ethmoid air cells. Electronically Signed   By: Lowella Grip III M.D.   On: 10/06/2018 15:07   Dg Chest Port 1 View  Result Date: 10/03/2018 CLINICAL DATA:  Pneumonia due to COVID-19 virus. EXAM: PORTABLE CHEST 1 VIEW COMPARISON:  Radiograph of October 01, 2018. FINDINGS: Stable cardiomediastinal silhouette. No pneumothorax or pleural effusion is noted. Bilateral lung opacities are noted which are improved compared to prior exam. Bony thorax is unremarkable. IMPRESSION: Decreased bilateral lung opacities are noted consistent with improving pneumonia. Electronically Signed   By: Marijo Conception M.D.   On: 10/03/2018 07:30   Dg Chest Port 1 View  Result Date: 10/01/2018 CLINICAL DATA:  58 year old female with history of cough. EXAM: PORTABLE CHEST 1 VIEW COMPARISON:  Chest x-ray 06/22/2013. FINDINGS: Patchy ill-defined opacities are noted throughout the lungs bilaterally, asymmetrically distributed, most severe throughout the mid to lower lungs. No definite pleural effusions. Heart size is normal. The patient is rotated to the left  on today's exam, resulting in distortion of the mediastinal contours and reduced diagnostic sensitivity and specificity for mediastinal pathology. Surgical clips project over the right infrahilar region. IMPRESSION: 1. The appearance the chest is highly concerning for multilobar bilateral pneumonia. Electronically Signed   By: Vinnie Langton M.D.   On: 10/01/2018 10:57    Subjective: Feels well, we spoke for about 30 minutes on subjects including her time living in [place], [place] running, the differences in  institutional and interpersonal racism here in the [place] and [place]. We spoke about her children, her complicated medical situations with many providers and the difficulty coordinating care across healthcare systems. This is remarkable compared to her perseverations 48-72 hours prior. She is completely alert, oriented, conversant, and pleasant. Slight cough, no chest pain and no dyspnea and no hypoxia throughout encounter.   Discharge Exam: Vitals:   10/11/18 0454 10/11/18 0748  BP: 115/78 118/78  Pulse: (!) 59 98  Resp: 17   Temp: 98.7 F (37.1 C) 98.1 F (36.7 C)  SpO2: 98% 95%   General: Pt is alert, awake, not in acute distress Cardiovascular: RRR, S1/S2 +, no rubs, no gallops Respiratory: CTA bilaterally, no wheezing, no rhonchi Abdominal: Soft, NT, ND, bowel sounds + Extremities: No edema, no cyanosis  Labs: BNP (last 3 results) No results for input(s): BNP in the last 8760 hours. Basic Metabolic Panel: Recent Labs  Lab 10/05/18 0320 10/06/18 0230 10/07/18 0234 10/10/18 0544  NA 143 143 142 140  K 3.6 3.2* 3.4* 3.7  CL 111 108 109 106  CO2 24 24 25 27   GLUCOSE 203* 111* 49* 98  BUN 30* 24* 26* 23*  CREATININE 0.75 0.67 0.60 0.66  CALCIUM 10.9* 11.1* 10.8* 9.9   Liver Function Tests: Recent Labs  Lab 10/05/18 0320 10/06/18 0230 10/07/18 0234 10/10/18 0544  AST 20 31 32 27  ALT 38 46* 53* 53*  ALKPHOS 52 54 50 50  BILITOT 0.7 0.5 0.8 0.7  PROT 6.7 7.0 6.4* 5.7*  ALBUMIN 3.0* 3.2* 2.9* 2.8*   No results for input(s): LIPASE, AMYLASE in the last 168 hours. Recent Labs  Lab 10/05/18 0320  AMMONIA 30   CBC: Recent Labs  Lab 10/05/18 0320 10/06/18 0230 10/10/18 0544  WBC 8.6 8.8 7.0  NEUTROABS 6.0 5.9 3.2  HGB 11.7* 12.3 10.5*  HCT 35.5* 37.9 33.0*  MCV 98.9 99.0 103.4*  PLT 331 340 302   Cardiac Enzymes: No results for input(s): CKTOTAL, CKMB, CKMBINDEX, TROPONINI in the last 168 hours. BNP: Invalid input(s): POCBNP CBG: Recent  Labs  Lab 10/10/18 1136 10/10/18 1547 10/10/18 2054 10/11/18 0747 10/11/18 1118  GLUCAP 180* 210* 223* 139* 155*   D-Dimer No results for input(s): DDIMER in the last 72 hours. Hgb A1c No results for input(s): HGBA1C in the last 72 hours. Lipid Profile No results for input(s): CHOL, HDL, LDLCALC, TRIG, CHOLHDL, LDLDIRECT in the last 72 hours. Thyroid function studies No results for input(s): TSH, T4TOTAL, T3FREE, THYROIDAB in the last 72 hours.  Invalid input(s): FREET3 Anemia work up No results for input(s): VITAMINB12, FOLATE, FERRITIN, TIBC, IRON, RETICCTPCT in the last 72 hours. Urinalysis    Component Value Date/Time   COLORURINE YELLOW 10/01/2018 0959   APPEARANCEUR HAZY (A) 10/01/2018 0959   LABSPEC 1.035 (H) 10/01/2018 0959   PHURINE 5.0 10/01/2018 0959   GLUCOSEU NEGATIVE 10/01/2018 0959   HGBUR SMALL (A) 10/01/2018 0959   BILIRUBINUR NEGATIVE 10/01/2018 0959   KETONESUR 5 (A) 10/01/2018 [id number]  PROTEINUR >=300 (A) 10/01/2018 0959   UROBILINOGEN 1.0 05/28/2009 2049   NITRITE NEGATIVE 10/01/2018 0959   LEUKOCYTESUR NEGATIVE 10/01/2018 0959    Microbiology No results found for this or any previous visit (from the past 240 hour(s)).  Time coordinating discharge: Approximately 40 minutes  Patrecia Pour, MD  [facility] 10/11/2018, 2:12 PM

## 2018-10-10 NOTE — Progress Notes (Signed)
Physical Therapy Treatment Patient Details Name: Gina Hill MRN: [id number] DOB: 04-01-1960 Today's Date: 10/10/2018    History of Present Illness 58 yo female presenting to ED with AMS and fever. Tested COVID positive. EEG negative for seizures. Stopped seizure meds with improved cognition. PMH including DM, depression, chronic pain syndrome, breast cancer, HTN, morbid obesity, and prior back sx (per pt report).     PT Comments    Patient much more alert and talkative. Continues with decr cognition and impaired balance, however this was also improved. VSS. Does not have 24/7 assist at home.    Follow Up Recommendations  SNF(family is unable to do 24/7 assist)     Equipment Recommendations  None recommended by PT    Recommendations for Other Services       Precautions / Restrictions Precautions Precautions: Fall Precaution Comments: Pt reports "dropping" spells, none today.  Restrictions Weight Bearing Restrictions: No    Mobility  Bed Mobility                  Transfers Overall transfer level: Needs assistance Equipment used: Rolling walker (2 wheeled) Transfers: Sit to/from Stand Sit to Stand: Min guard         General transfer comment: Min guard assist for safety; vc for safe use of RW  Ambulation/Gait Ambulation/Gait assistance: Min guard Gait Distance (Feet): 100 Feet Assistive device: Rolling walker (2 wheeled) Gait Pattern/deviations: Step-through pattern;Decreased stride length;Drifts right/left;Trunk flexed;Wide base of support Gait velocity: decr   General Gait Details: Pt with very slow gait pattern, at times needed help to steer RW around objects.  walking and talking throughout on RA with VSS.    Stairs             Wheelchair Mobility    Modified Rankin (Stroke Patients Only)       Balance Overall balance assessment: Needs assistance Sitting-balance support: Feet supported;No upper extremity supported Sitting balance-Leahy  Scale: Good     Standing balance support: Bilateral upper extremity supported Standing balance-Leahy Scale: Poor Standing balance comment: needs external support from RW.                             Cognition Arousal/Alertness: Awake/alert Behavior During Therapy: WFL for tasks assessed/performed Overall Cognitive Status: Impaired/Different from baseline Area of Impairment: Orientation;Attention;Memory;Following commands;Safety/judgement;Awareness;Problem solving                 Orientation Level: Disoriented to;Time Current Attention Level: Sustained Memory: Decreased short-term memory Following Commands: Follows one step commands consistently Safety/Judgement: Decreased awareness of safety;Decreased awareness of deficits Awareness: Emergent Problem Solving: Slow processing General Comments: Much improved, but continues to wander off topic       Exercises      General Comments General comments (skin integrity, edema, etc.): sats stable on room air      Pertinent Vitals/Pain Pain Assessment: No/denies pain Faces Pain Scale: No hurt    Home Living Family/patient expects to be discharged to:: Private residence Living Arrangements: Spouse/significant other;Children Available Help at Discharge: Family;Personal care attendant Type of Home: House Home Access: Stairs to enter Entrance Stairs-Rails: None Home Layout: One level Home Equipment: Environmental consultant - 2 wheels Additional Comments: pt not very talkative and did not answer about the shower seat, ask again    Prior Function Level of Independence: Independent with assistive device(s)      Comments: Pt reporting she uses RW at home. She also reports she performs BADLs  and PCA performs IADLs and driving. Daughter (facetiming patient during session) reports that pt will stay in bed during the day and her blood sugar with drop; she will get OOB to eat dinner with her husband when he returned home in the evenings.    PT Goals (current goals can now be found in the care plan section) Acute Rehab PT Goals Patient Stated Goal: "Go home" Time For Goal Achievement: 10/17/18 Potential to Achieve Goals: Good Progress towards PT goals: Progressing toward goals    Frequency    Min 3X/week      PT Plan Current plan remains appropriate    Co-evaluation              AM-PAC PT "6 Clicks" Mobility   Outcome Measure  Help needed turning from your back to your side while in a flat bed without using bedrails?: A Little Help needed moving from lying on your back to sitting on the side of a flat bed without using bedrails?: A Little Help needed moving to and from a bed to a chair (including a wheelchair)?: A Little Help needed standing up from a chair using your arms (e.g., wheelchair or bedside chair)?: A Little Help needed to walk in hospital room?: A Little Help needed climbing 3-5 steps with a railing? : A Little 6 Click Score: 18    End of Session Equipment Utilized During Treatment: Gait belt Activity Tolerance: Patient limited by fatigue Patient left: in chair;with call bell/phone within reach;with chair alarm set   PT Visit Diagnosis: Unsteadiness on feet (R26.81);Muscle weakness (generalized) (M62.81);Other symptoms and signs involving the nervous system (R29.898);Difficulty in walking, not elsewhere classified (R26.2)     Time: [id number] PT Time Calculation (min) (ACUTE ONLY): 25 min  Charges:  $Gait Training: 23-37 mins                       Barry Brunner, PT       Rexanne Mano 10/10/2018, 5:39 PM

## 2018-10-11 LAB — NOVEL CORONAVIRUS, NAA (HOSP ORDER, SEND-OUT TO REF LAB; TAT 18-24 HRS)

## 2018-10-11 LAB — GLUCOSE, CAPILLARY
Glucose-Capillary: 139 mg/dL — ABNORMAL HIGH (ref 70–99)
Glucose-Capillary: 155 mg/dL — ABNORMAL HIGH (ref 70–99)
Glucose-Capillary: 183 mg/dL — ABNORMAL HIGH (ref 70–99)
Glucose-Capillary: 187 mg/dL — ABNORMAL HIGH (ref 70–99)

## 2018-10-11 MED ORDER — HYDROCORTISONE 5 MG PO TABS: ORAL_TABLET | ORAL | 0 refills | Status: DC

## 2018-10-11 NOTE — Progress Notes (Signed)
Occupational Therapy Treatment Patient Details Name: Gina Hill MRN: [id number] DOB: Aug 06, 1960 Today's Date: 10/11/2018    History of present illness 58 yo female presenting to ED with AMS and fever. Tested COVID positive. EEG negative for seizures. Stopped seizure meds with improved cognition. PMH including DM, depression, chronic pain syndrome, breast cancer, HTN, morbid obesity, and prior back sx (per pt report).    OT comments  Pt presents seated in recliner, pleasant and willing to participate in therapy session. Pt with notable improvements in cognition today. Continues to remain easily distracted/tangential and requiring redirection but oriented and following commands, good recall of RN and MD names and of current situation (awaiting discharge to SNF), achieving score of 2 on Short Blessed Test (within normal limits for basic cognition, would benefit from higher level challenges). Pt performing functional transfers using RW at [facility] assist level. Issued level 2 theraband and engaging in bil UE HEP for continued strengthening/endurance. Continue to recommend SNF level therapies at time of discharge to further address cognition and to progress pt towards PLOF. Will follow while acutely admitted.    Follow Up Recommendations  SNF;Supervision/Assistance - 24 hour    Equipment Recommendations  3 in 1 bedside commode;Other (comment)(TBD in next venue)          Precautions / Restrictions Precautions Precautions: Fall Precaution Comments: Pt reports "dropping" spells, none today.  Restrictions Weight Bearing Restrictions: No       Mobility Bed Mobility               General bed mobility comments: received OOB in recliner  Transfers Overall transfer level: Needs assistance Equipment used: Rolling walker (2 wheeled) Transfers: Sit to/from Stand Sit to Stand: Min guard         General transfer comment: Min guard assist for safety; vc for safe use of RW    Balance  Overall balance assessment: Needs assistance Sitting-balance support: Feet supported;No upper extremity supported Sitting balance-Leahy Scale: Good     Standing balance support: Bilateral upper extremity supported Standing balance-Leahy Scale: Poor Standing balance comment: reliant on UE support                           ADL either performed or assessed with clinical judgement   ADL Overall ADL's : Needs assistance/impaired                                     Functional mobility during ADLs: Min guard;Rolling walker General ADL Comments: pt with improvements in cognition, mobility today     Vision       Perception     Praxis      Cognition Arousal/Alertness: Awake/alert Behavior During Therapy: WFL for tasks assessed/performed Overall Cognitive Status: Impaired/Different from baseline Area of Impairment: Attention;Memory;Safety/judgement;Awareness                   Current Attention Level: Selective Memory: Decreased short-term memory Following Commands: Follows one step commands consistently Safety/Judgement: Decreased awareness of safety;Decreased awareness of deficits Awareness: Emergent Problem Solving: Slow processing General Comments: Much improved, but continues to wander off topic and tangential, requires redirection. pt completed Short Blessed Test with score of 2 today (within normal range). pt also verbalizing awareness today of cognitive impairments since admission. would benefit from additional challenge to higher level cognition. asking appropriate questions about pending discharge to SNF  Exercises Exercises: General Upper Extremity;Other exercises General Exercises - Upper Extremity Shoulder Flexion: AROM;Both;10 reps;Seated;Theraband Theraband Level (Shoulder Flexion): Level 2 (Red) Shoulder Horizontal ABduction: AROM;10 reps;Seated;Theraband Theraband Level (Shoulder Horizontal Abduction): Level 2  (Red) Shoulder Horizontal ADduction: AROM;Both;10 reps;Theraband Theraband Level (Shoulder Horizontal Adduction): Level 2 (Red) Elbow Flexion: AROM;Both;10 reps;Seated;Theraband Theraband Level (Elbow Flexion): Level 2 (Red) Elbow Extension: AROM;Both;10 reps;Theraband Theraband Level (Elbow Extension): Level 2 (Red) Other Exercises Other Exercises: marching in place at RW, x10 reps bil LEs, x2 sets, standing rest in between   Shoulder Instructions       General Comments VSS on RA    Pertinent Vitals/ Pain       Pain Assessment: No/denies pain Faces Pain Scale: No hurt  Home Living                                          Prior Functioning/Environment              Frequency  Min 3X/week        Progress Toward Goals  OT Goals(current goals can now be found in the care plan section)  Progress towards OT goals: Progressing toward goals  Acute Rehab OT Goals Patient Stated Goal: "Go home" OT Goal Formulation: With patient Time For Goal Achievement: 10/16/18 Potential to Achieve Goals: Good ADL Goals Pt Will Perform Grooming: standing;with modified independence Pt Will Perform Lower Body Dressing: with modified independence;sit to/from stand Pt Will Transfer to Toilet: with modified independence;ambulating;bedside commode Pt Will Perform Toileting - Clothing Manipulation and hygiene: with modified independence;sit to/from stand;sitting/lateral leans Pt/caregiver will Perform Home Exercise Program: Increased strength;Both right and left upper extremity;Independently;With written HEP provided;With theraband Additional ADL Goal #1: Pt will independently verbalize three energy conservation strategies for ADLs Additional ADL Goal #2: Pt will demonstrate alternating attention between two ADLs with min cues  Plan Discharge plan remains appropriate    Co-evaluation                 AM-PAC OT "6 Clicks" Daily Activity     Outcome Measure   Help  from another person eating meals?: None Help from another person taking care of personal grooming?: A Little Help from another person toileting, which includes using toliet, bedpan, or urinal?: A Little Help from another person bathing (including washing, rinsing, drying)?: A Little Help from another person to put on and taking off regular upper body clothing?: A Little Help from another person to put on and taking off regular lower body clothing?: A Lot 6 Click Score: 18    End of Session Equipment Utilized During Treatment: Rolling walker  OT Visit Diagnosis: Unsteadiness on feet (R26.81);Other abnormalities of gait and mobility (R26.89);Muscle weakness (generalized) (M62.81);Other symptoms and signs involving cognitive function   Activity Tolerance Patient tolerated treatment well   Patient Left in chair;with call bell/phone within reach;with chair alarm set   Nurse Communication Mobility status        Time: [id number] OT Time Calculation (min): 51 min  Charges: OT General Charges $OT Visit: 1 Visit OT Treatments $Therapeutic Activity: 23-37 mins $Therapeutic Exercise: 8-22 mins  Lou Cal, OT [facility] Pager [phone] Office [phone]    Raymondo Band 10/11/2018, 4:09 PM

## 2018-10-11 NOTE — Progress Notes (Signed)
Patient seen on rounds this morning. She was discharged in stable and improved condition 9/21 to [facility] pending results of repeat covid testing to determine location within SNF at disposition. This remains pending. No significant changes to the discharge summary are needed at this time.   Vance Gather, MD 10/11/2018 2:10 PM

## 2018-10-12 LAB — GLUCOSE, CAPILLARY
Glucose-Capillary: 145 mg/dL — ABNORMAL HIGH (ref 70–99)
Glucose-Capillary: 181 mg/dL — ABNORMAL HIGH (ref 70–99)
Glucose-Capillary: 164 mg/dL — ABNORMAL HIGH (ref 70–99)
Glucose-Capillary: 171 mg/dL — ABNORMAL HIGH (ref 70–99)

## 2018-10-12 LAB — SARS CORONAVIRUS 2 (TAT 6-24 HRS): SARS Coronavirus 2: POSITIVE — AB

## 2018-10-12 MED ORDER — HYDROCORTISONE 5 MG PO TABS
5.0000 mg | ORAL_TABLET | Freq: Every day | ORAL | Status: DC
  Administered 2018-10-13: 09:00:00 5 mg via ORAL
  Filled 2018-10-12 (×2): qty 1

## 2018-10-12 NOTE — Plan of Care (Signed)
  Problem: Respiratory: Goal: Will maintain a patent airway Outcome: Progressing Goal: Complications related to the disease process, condition or treatment will be avoided or minimized Outcome: Progressing   

## 2018-10-12 NOTE — Progress Notes (Addendum)
Physical Therapy Treatment Patient Details Name: Gina Hill MRN: [id number] DOB: 19-Jul-1960 Today's Date: 10/12/2018    History of Present Illness 58 yo female presenting to ED with AMS and fever. Tested COVID positive. EEG negative for seizures. Stopped seizure meds with improved cognition. PMH including DM, depression, chronic pain syndrome, breast cancer, HTN, morbid obesity, and prior back sx (per pt report).     PT Comments    Pt showing progress with mobility and tx, she has shown improvement in level of independence and activity tolerance. She reports that she feels as if she is at baseline functioning but also agrees that she will need assistance when home, which family may not be able to fully provide. Pt was able to ambulate increased distance on room air and SBA and maintain 02 sats in high 90s.    Follow Up Recommendations  SNF recommended     Equipment Recommendations  Rolling walker with 5" wheels    Recommendations for Other Services       Precautions / Restrictions Precautions Precautions: Fall Precaution Comments: "drooping" spells, forgetfulness Restrictions Weight Bearing Restrictions: No    Mobility  Bed Mobility Overal bed mobility: Modified Independent Bed Mobility: Rolling;Supine to Sit Rolling: Modified independent (Device/Increase time)   Supine to sit: Modified independent (Device/Increase time)        Transfers Overall transfer level: Needs assistance Equipment used: Rolling walker (2 wheeled) Transfers: Risk manager;Sit to/from Stand Sit to Stand: Modified independent (Device/Increase time) Stand pivot transfers: Supervision          Ambulation/Gait Ambulation/Gait assistance: Supervision Gait Distance (Feet): 175 Feet Assistive device: Rolling walker (2 wheeled) Gait Pattern/deviations: Step-through pattern Gait velocity: very slow   General Gait Details: slow cadenced gait, was able to ambulate 14ft x 2 w/ RW and  SBA on room air with sats range 96-99%   Stairs             Wheelchair Mobility    Modified Rankin (Stroke Patients Only)       Balance Overall balance assessment: Needs assistance   Sitting balance-Leahy Scale: Good     Standing balance support: No upper extremity supported Standing balance-Leahy Scale: Fair                              Cognition Arousal/Alertness: Awake/alert Behavior During Therapy: WFL for tasks assessed/performed Overall Cognitive Status: History of cognitive impairments - at baseline Area of Impairment: Safety/judgement;Awareness;Problem solving                 Orientation Level: Person;Place;Time;Situation Current Attention Level: Alternating   Following Commands: Follows multi-step commands inconsistently Safety/Judgement: Decreased awareness of deficits   Problem Solving: Requires verbal cues;Slow processing General Comments: continues to improve but still has moments of difficulty expressing herself or finding certain words      Exercises      General Comments General comments (skin integrity, edema, etc.): does well on room air able to tolerate more today than previous also does not need as much assist/rest breaks      Pertinent Vitals/Pain Pain Assessment: No/denies pain Faces Pain Scale: No hurt    Home Living                      Prior Function            PT Goals (current goals can now be found in the care plan section) Acute Rehab PT  Goals Patient Stated Goal: go home w/ home care and family to assist as needed Time For Goal Achievement: 10/17/18 Potential to Achieve Goals: Good Progress towards PT goals: Progressing toward goals    Frequency    Min 3X/week      PT Plan Discharge plan needs to be updated    Co-evaluation              AM-PAC PT "6 Clicks" Mobility   Outcome Measure  Help needed turning from your back to your side while in a flat bed without using  bedrails?: None Help needed moving from lying on your back to sitting on the side of a flat bed without using bedrails?: A Little Help needed moving to and from a bed to a chair (including a wheelchair)?: A Little Help needed standing up from a chair using your arms (e.g., wheelchair or bedside chair)?: A Little Help needed to walk in hospital room?: A Little Help needed climbing 3-5 steps with a railing? : A Lot 6 Click Score: 18    End of Session   Activity Tolerance: Treatment limited secondary to medical complications (Comment);Patient limited by fatigue Patient left: in chair;with call bell/phone within reach;with chair alarm set   PT Visit Diagnosis: Unsteadiness on feet (R26.81);Muscle weakness (generalized) (M62.81);Other symptoms and signs involving the nervous system (R29.898);Difficulty in walking, not elsewhere classified (R26.2)     Time: [id number] PT Time Calculation (min) (ACUTE ONLY): 51 min  Charges:  $Gait Training: 23-37 mins $Therapeutic Exercise: 8-22 mins                     Horald Chestnut, PT    Delford Field 10/12/2018, 1:55 PM

## 2018-10-12 NOTE — Progress Notes (Signed)
PROGRESS NOTE  Gina Hill  [id number] DOB: 1960/12/16 DOA: 10/01/2018 PCP: Dion Body, MD, PCP in [place] is with [facility].   Brief Narrative:  Gina Hill is a 58 y.o. female with a history of T2DM, morbid obesity, HTN, and chronic pain syndrome on chronic opioid medication who was brought to the ED 9/12 by EMS for fevers up to 104F and altered mental status in the setting of URI symptoms and cough for 2-3 days. In the ED the patient was febrile to 102.25F and hypoxic to 82% on room air with bilateral infiltrates on CXR and positive SARS-CoV-2 PCR test.     Subjective:  Patient reports he is feeling much better, dyspnea has resolved, no chest pain, no nausea or vomiting.   Assessment & Plan: Principal Problem:   Acute Hypoxic Respiratory failure, acute--- due to COVID-19 Active Problems:   Hypertension   Diabetes ([place])   Acute respiratory disease due to COVID-19 virus   Pneumonia due to COVID-19 virus   Acute hypoxic respiratory failure due to covid-19 pneumonia and possible CAP:  -Hypoxia has resolved, currently on room air- Completed 5 days of remdesivir (9/12 - 9/16) - Completed cefepime x5 days (9/12 - 9/16) - Completed 7 days of steroids.  - Continue airborne, contact precautions. PPE including surgical gown, gloves, cap, shoe covers, and CAPR used during this encounter in a negative pressure room.  - CRP 20 >> 1.0 - Avoid NSAIDs  Acute metabolic encephalopathy: -Resolved, mentation back to baseline.  Chronic pain syndrome, lumbar stenosis with sciatica, depression:  - Buprenorphine 34mcg/hr patch stopped, MS contin now stopped. Under the care of Dr. Kathi Der as outpatient, has gotten injections. Had RFA performed earlier this year and has reported decline in functional status since that time with planned repeat procedure later this year. - Continue lyrica and effexor. Also on topiramate which is known to cause confusion and was stopped.  Elevated  d-dimer: Initially >20, rapidly decreased without therapuetic anticoagulation and no longer hypoxic without symptoms of DVT.  - Continue prophylactic lovenox, no further workup currently planned.   T2DM with hyperglycemia and hypoglycemia:  - Steroid-induced worsening noted and improved/decreased insulin requirement with coming off. -CBG controlled on current regimen prolonged hypoglycemia reported PTA by family. - Holding home medications. HbA1c 6.6% indicates likely too tight of control in frail patient.   Covid-19 gastroenteritis: Improving clinically - Continue supportive care  LFT elevation: Mild. Likely due to viral infection. Ammonia normal.  - Continue monitoring.  Morbid obesity: BMI 35.  - Weight loss recommended long term  Hypokalemia: Improved today but still slightly low. - Supplement again today.   Hypomagnesemia:  - Supplement as needed to maintain goal of 2.   HTN: -Resolved  Hypotension/hypglycemia: -Most likely in the setting of COVID-19 infection, cortisol a.m. test was checked, was low at 4.4, but patient has been on Decadron few days before that, so this result is inaccurate) artificially low secondary to steroid treatment), will continue empirically with rapid hydrocortisone taper.  Normocytic anemia: Possibly due to acute illness  - Monitor for bleeding. Recheck CBC due to low blood pressure which has resolved.  DVT prophylaxis: Lovenox 0.5mg /kg q24h = 50mg  Code Status: Full Family Communication: Discussed with the patient  disposition Plan: SNF, pending with 19 test results  Consultants:   PT/OT  Neurology  Procedures:   None  Antimicrobials:  Remdesivir 9/12 - 9/16  Vancomycin 9/12  Flagyl 9/12  Cefepime 9/12 - 9/16  Objective: Vitals:   10/11/18 1547 10/11/18  1900 10/12/18 0443 10/12/18 0745  BP: (!) 106/56 (!) 100/59 100/72 116/73  Pulse: 83 83 89 81  Resp: 13 14 15 16   Temp: 98.5 F (36.9 C) 98.7 F (37.1 C) 99 F (37.2 C)  98.7 F (37.1 C)  TempSrc: Oral Oral Oral Oral  SpO2:  96% 99% 96%  Weight:      Height:        Intake/Output Summary (Last 24 hours) at 10/12/2018 1348 Last data filed at 10/12/2018 0518 Gross per 24 hour  Intake 540 ml  Output 850 ml  Net -310 ml   Filed Weights   10/01/18 0955 10/01/18 1216  Weight: 107.5 kg 102.1 kg   Awake Alert, Oriented X 3, No new F.N deficits, Normal affect Symmetrical Chest wall movement, Good air movement bilaterally, CTAB RRR,No Gallops,Rubs or new Murmurs, No Parasternal Heave +ve B.Sounds, Abd Soft, No tenderness, No rebound - guarding or rigidity. No Cyanosis, Clubbing or edema, No new Rash or bruise     Data Reviewed: I have personally reviewed following labs and imaging studies  CBC: Recent Labs  Lab 10/06/18 0230 10/10/18 0544  WBC 8.8 7.0  NEUTROABS 5.9 3.2  HGB 12.3 10.5*  HCT 37.9 33.0*  MCV 99.0 103.4*  PLT 340 [id number]   Basic Metabolic Panel: Recent Labs  Lab 10/06/18 0230 10/07/18 0234 10/10/18 0544  NA 143 142 140  K 3.2* 3.4* 3.7  CL 108 109 106  CO2 24 25 27   GLUCOSE 111* 49* 98  BUN 24* 26* 23*  CREATININE 0.67 0.60 0.66  CALCIUM 11.1* 10.8* 9.9   GFR: Estimated Creatinine Clearance: 96.4 mL/min (by C-G formula based on SCr of 0.66 mg/dL). Liver Function Tests: Recent Labs  Lab 10/06/18 0230 10/07/18 0234 10/10/18 0544  AST 31 32 27  ALT 46* 53* 53*  ALKPHOS 54 50 50  BILITOT 0.5 0.8 0.7  PROT 7.0 6.4* 5.7*  ALBUMIN 3.2* 2.9* 2.8*   No results for input(s): LIPASE, AMYLASE in the last 168 hours. No results for input(s): AMMONIA in the last 168 hours. Coagulation Profile: No results for input(s): INR, PROTIME in the last 168 hours. Cardiac Enzymes: No results for input(s): CKTOTAL, CKMB, CKMBINDEX, TROPONINI in the last 168 hours. BNP (last 3 results) No results for input(s): PROBNP in the last 8760 hours. HbA1C: No results for input(s): HGBA1C in the last 72 hours. CBG: Recent Labs  Lab 10/11/18  1118 10/11/18 1547 10/11/18 2114 10/12/18 0750 10/12/18 1325  GLUCAP 155* 183* 187* 145* 181*   Lipid Profile: No results for input(s): CHOL, HDL, LDLCALC, TRIG, CHOLHDL, LDLDIRECT in the last 72 hours. Thyroid Function Tests: No results for input(s): TSH, T4TOTAL, FREET4, T3FREE, THYROIDAB in the last 72 hours. Anemia Panel: No results for input(s): VITAMINB12, FOLATE, FERRITIN, TIBC, IRON, RETICCTPCT in the last 72 hours. Urine analysis:    Component Value Date/Time   COLORURINE YELLOW 10/01/2018 0959   APPEARANCEUR HAZY (A) 10/01/2018 0959   LABSPEC 1.035 (H) 10/01/2018 0959   PHURINE 5.0 10/01/2018 0959   GLUCOSEU NEGATIVE 10/01/2018 0959   HGBUR SMALL (A) 10/01/2018 0959   BILIRUBINUR NEGATIVE 10/01/2018 0959   KETONESUR 5 (A) 10/01/2018 0959   PROTEINUR >=300 (A) 10/01/2018 0959   UROBILINOGEN 1.0 05/28/2009 2049   NITRITE NEGATIVE 10/01/2018 0959   LEUKOCYTESUR NEGATIVE 10/01/2018 0959   Recent Results (from the past 240 hour(s))  Novel Coronavirus, NAA (hospital order; send-out to ref lab)     Status: Abnormal   Collection Time: 10/09/18  7:31 PM   Specimen: Nasopharyngeal Swab; Respiratory  Result Value Ref Range Status   SARS-CoV-2, NAA Comment (A) NOT DETECTED Final    Comment: (NOTE) Indeterminate We are UNABLE to reliably determine a result for the specimen due to the inconsistent amplification of all of the required SARS-CoV-2 components from the specimen submitted. If clinically indicated, please recollect an additional specimen for testing. This nucleic acid amplification test was developed and its performance characteristics determined by [employer]. Nucleic acid amplification tests include PCR and TMA. This test has not been FDA cleared or approved. This test has been authorized by FDA under an Emergency Use Authorization (EUA). This test is only authorized for the duration of time the declaration that circumstances exist justifying the  authorization of the emergency use of in vitro diagnostic tests for detection of SARS-CoV-2 virus and/or diagnosis of COVID-19 infection under section 564(b)(1) of the Act, 21 U.S.C. [id number]) (1), unless the authorization is terminated or revoked sooner. When diagnostic testing is negative,  the possibility of a false negative result should be considered in the context of a patient's recent exposures and the presence of clinical signs and symptoms consistent with COVID-19. An individual without symptoms of COVID- 19 and who is not shedding SARS-CoV-2 virus would expect to have a negative (not detected) result in this assay. Performed At: [facility] [place], [place] [id number] Rush Farmer MD [id number]    [place]  Final    Comment: Performed at [facility] [address], [place], [place] [address]      Radiology Studies: No results found.  Scheduled Meds: . enoxaparin (LOVENOX) injection  50 mg Subcutaneous Q12H  . famotidine  20 mg Oral Daily  . folic acid  1 mg Oral Daily  . hydrocortisone  10 mg Oral Daily  . insulin aspart  0-5 Units Subcutaneous QHS  . insulin aspart  0-9 Units Subcutaneous TID WC  . insulin glargine  14 Units Subcutaneous QHS  . ipratropium  2 puff Inhalation Q6H  . multivitamin with minerals  1 tablet Oral Daily  . oxybutynin  10 mg Oral Daily  . pregabalin  150 mg Oral QID  . sodium chloride flush  3 mL Intravenous Q12H  . thiamine  100 mg Oral Daily  . venlafaxine XR  37.5 mg Oral QHS  . vitamin C  500 mg Oral Daily  . zinc sulfate  220 mg Oral Daily   Continuous Infusions: . sodium chloride       LOS: 11 days    Phillips Climes, MD [facility] [email or web address] Password [email or web address] 10/12/2018, 1:48 PM

## 2018-10-12 NOTE — Progress Notes (Signed)
Called pt's daughter to update her on her mother's plan of care.  She appreciated the update.

## 2018-10-13 LAB — GLUCOSE, CAPILLARY
Glucose-Capillary: 157 mg/dL — ABNORMAL HIGH (ref 70–99)
Glucose-Capillary: 109 mg/dL — ABNORMAL HIGH (ref 70–99)

## 2018-10-13 LAB — NOVEL CORONAVIRUS, NAA (HOSP ORDER, SEND-OUT TO REF LAB; TAT 18-24 HRS): SARS-CoV-2, NAA: DETECTED — AB

## 2018-10-13 MED ORDER — HYDROCORTISONE 5 MG PO TABS: ORAL_TABLET | ORAL | 0 refills | Status: DC

## 2018-10-13 MED ORDER — GLIPIZIDE 5 MG PO TABS: 2.5000 mg | ORAL_TABLET | Freq: Every day | ORAL | 0 refills | Status: DC

## 2018-10-13 NOTE — Progress Notes (Signed)
Physical Therapy Treatment Patient Details Name: Gina Hill MRN: [id number] DOB: Nov 27, 1960 Today's Date: 10/13/2018    History of Present Illness 58 yo female presenting to ED with AMS and fever. Tested COVID positive. EEG negative for seizures. Stopped seizure meds with improved cognition. PMH including DM, depression, chronic pain syndrome, breast cancer, HTN, morbid obesity, and prior back sx (per pt report).     PT Comments    Patient with continued improved cognition and now plans to discharge home with husband. Previously pt had been too confused to confirm DME she already owns. Updated chart and pt does NOT need any DME ordered prior to discharge. Patient with multiple questions re: being around her family with the virus and going out into public with time spent answering pt's questions based on CDC current guidelines.    Follow Up Recommendations  Home health PT     Equipment Recommendations  None recommended by PT( pt owns RW, BSC, shower chair)    Recommendations for Other Services       Precautions / Restrictions Precautions Precautions: Fall    Mobility  Bed Mobility Overal bed mobility: Modified Independent       Supine to sit: Modified independent (Device/Increase time)     General bed mobility comments: including manipulating covers  Transfers                 General transfer comment: deferred due to pt reporting painful bottom and did not want to sit in firm chair  Ambulation/Gait                 Stairs             Wheelchair Mobility    Modified Rankin (Stroke Patients Only)       Balance     Sitting balance-Leahy Scale: Good                                      Cognition Arousal/Alertness: Awake/alert Behavior During Therapy: WFL for tasks assessed/performed Overall Cognitive Status: History of cognitive impairments - at baseline                                 General Comments:  cognition much improved; asking approp questions re: wearing mask, ?shield, distancing from family members who are negative      Exercises      General Comments General comments (skin integrity, edema, etc.): Contacted by RN re: pt discharging today and need updated recommendation for DME; pt had been confused on admission and never got updated DME list of what pt already owns; see below      Pertinent Vitals/Pain Pain Assessment: Faces Faces Pain Scale: Hurts even more Pain Location: "bottom" Pain Descriptors / Indicators: Burning;Sore Pain Intervention(s): Repositioned;Other (comment)(pt set up to sit EOB (more cushioned than chair); RN address)    Home Living               Home Equipment: Walker - 2 wheels;Bedside commode;Shower seat      Prior Function            PT Goals (current goals can now be found in the care plan section) Acute Rehab PT Goals Patient Stated Goal: go home w/ home care and family to assist as needed Time For Goal Achievement: 10/17/18 Potential to Achieve Goals: Good Progress  towards PT goals: Progressing toward goals    Frequency    Min 3X/week      PT Plan Discharge plan needs to be updated    Co-evaluation              AM-PAC PT "6 Clicks" Mobility   Outcome Measure  Help needed turning from your back to your side while in a flat bed without using bedrails?: None Help needed moving from lying on your back to sitting on the side of a flat bed without using bedrails?: A Little Help needed moving to and from a bed to a chair (including a wheelchair)?: A Little Help needed standing up from a chair using your arms (e.g., wheelchair or bedside chair)?: A Little Help needed to walk in hospital room?: A Little Help needed climbing 3-5 steps with a railing? : A Little 6 Click Score: 19    End of Session   Activity Tolerance: Patient tolerated treatment well Patient left: in bed;with call bell/phone within reach(sit EOB) Nurse  Communication: Other (comment)(no DME needs for discharge) PT Visit Diagnosis: Unsteadiness on feet (R26.81);Muscle weakness (generalized) (M62.81);Other symptoms and signs involving the nervous system (R29.898);Difficulty in walking, not elsewhere classified (R26.2)     Time: [id number] PT Time Calculation (min) (ACUTE ONLY): 19 min  Charges:  $Self Care/Home Management: 8-22                       Barry Brunner, PT       Rexanne Mano 10/13/2018, 12:23 PM

## 2018-10-13 NOTE — Plan of Care (Signed)
Family updated. Spoke with patients daughter via facetime. No other concerns at this time.

## 2018-10-13 NOTE — Discharge Summary (Signed)
Gina Hill, is a 58 y.o. female  DOB January 09, 1961  MRN [id number].  Admission date:  10/01/2018  Admitting Physician  Roxan Hockey, MD  Discharge Date:  10/13/2018   Primary MD  Dion Body, MD  Recommendations for primary care physician for things to follow:  -please check CBC, BMP during next visit. - Follow up with PCP in 1-2 weeks - Follow up with oncology per routine - Follow up with pain management per routine.  - Note following changes to regimen: Stop topamax due to confusion, stop antihypoglycemic agents due to hypoglycemia. Stop metoprolol due to hypotension. Note taper of hydrocortisone for possible relative adrenal insufficiency. Very likely will need to restart medications based on clinical trajectory. - Psychiatry and/or psychotherapy recommended as an outpatient to address depression. Patient would benefit from a provider with a background and life experiences similar to the patient. An African-American female would be preferred.     Admission Diagnosis  Hypoxia [R09.02] COVID-19 virus infection [U07.1]   Discharge Diagnosis  Hypoxia [R09.02] COVID-19 virus infection [U07.1]   Principal Problem:   Acute Hypoxic Respiratory failure, acute--- due to COVID-19 Active Problems:   Hypertension   Diabetes ([place])   Acute respiratory disease due to COVID-19 virus   Pneumonia due to COVID-19 virus      Past Medical History:  Diagnosis Date   Arthritis    Asthma    Cancer ([place])    right breast   Chronic back pain    Chronic pelvic pain in female    Complication of anesthesia    SOB after anesthesia so " I usually have to get a breathing treatment after anesthesia."   Diabetes mellitus without complication (HCC)    Endometriosis    GERD (gastroesophageal reflux disease)    Hypercholesterolemia    Hyperparathyroidism ([place])    Hypertension    Neuropathic pain      Obese     Past Surgical History:  Procedure Laterality Date   BREAST BIOPSY     Right   CESAREAN SECTION     FOOT SURGERY     Left foot   LAPAROSCOPIC LYSIS OF ADHESIONS     LAPAROTOMY     PORTACATH PLACEMENT Left 06/22/2013   Procedure: INSERTION PORT-A-CATH;  Surgeon: Merrie Roof, MD;  Location: [facility];  Service: General;  Laterality: Left;   SALPINGECTOMY     Partial Right     WISDOM TOOTH EXTRACTION         History of present illness and  Hospital Course:     Kindly see H&P for history of present illness and admission details, please review complete Labs, Consult reports and Test reports for all details in brief  HPI  from the history and physical done on the day of admission  Brief/Interim Summary: Gina Hill a 58 y.o.femalewith a history of breast CA, T2DM, morbid obesity, HTN, and chronic pain syndrome on chronic opioid medication who was brought to the ED 9/12 by EMS for fevers up to 104F and altered  mental status in the setting of URI symptoms and cough for 2-3 days. In the ED the patient was febrile to 102.9F and hypoxic to 82% on room air with bilateral infiltrates on CXR and positive SARS-CoV-2 PCR test. Remdesivir and steroids were started as was cefepime for possibility of bacterial PNA as well. With treatment hypoxia, shortness of breath, and fevers have resolved. The patient remained encephalopathic, with a work up that did not reveal significant/specific findings on neuroimaging or EEG. Butrans patch was removed and topamax stopped with subsequent return to cognitive baseline. Due to severity of chronic pain, the butrans patch is planned to be restarted and followed up by pain management as an outpatient. Due to recurrent hypoglycemia and some soft blood pressures, cortisol was checked, found to be low. The patient will be placed on a more prolonged steroid taper than usually indicated for covid-19 for this reason. PT and OT have worked regularly  with the patient and recommend short term rehabilitation at a facility prior to returning home, discussed with patient at day of discharge, and she would rather go home, so home health has been arranged, including PT/OT/visiting nurse and aide.  Hospital Course    Acute metabolic encephalopathy: -Resolved, mentation back to baseline.  Presentation is most consistent with global insult. With no documented hypotension nor profound hypoxemia, most likely related to prolonged hypoglycemia prior to admission and having 2 butrans patches on. No suggestive signs of PRES. Polypharmacy, possibly psychogenic component with restricted affect also possibly contributing. Nonfocal exam. TSH is wnl. RPR NR. CT head without acute findings and minimal chronic microvascular findings. EEG w/o epileptiform discharges.  - Significant improvement over the past 48-72 hours in setting of stopping narcotics, topamax and resolution of severe hypoglycemia. - Neurology consulted, evaluated patient 9/18, no further recommendations.  - Avoid hypoglycemia. Pt is brittle diabetic, would permit some hyperglycemia. - Has completed cefepime. Neurology feels this could have contributed.   Chronic pain syndrome, lumbar stenosis with sciatica, depression:  - Buprenorphine 44mcg/hr patch stopped and to be restarted. Under the care of Dr. Kathi Der as outpatient, has gotten injections. Had RFA performed earlier this year and has reported decline in functional status since that time with planned repeat procedure later this year. - Continue lyrica and effexor. Also on topiramate which is known to cause confusionand wasstopped.  Elevated d-dimer: Initially >20, rapidly decreased without therapuetic anticoagulation and no longer hypoxic without symptoms of DVT.  - No further workup currently planned.   T2DM with hyperglycemiaand hypoglycemia: Steroid-induced worsening notedand improved/decreased insulin requirement with coming off.  Prolonged hypoglycemia reported PTA by family. - Holding home medications. HbA1c 6.6% indicates very tight control. Will allow some permissive hyperglycemia temporarily to avoid hypoglycemia. Continue follow up with outpatient providers, Dr. Dwyane Dee.  Covid-19 gastroenteritis: Resolved.  LFT elevation: Mild. Likely due to viral infection. Ammonia normal.  - Continue monitoring.  Morbid obesity: BMI 35.  - Weight loss recommended long term  Hypokalemia: Improved with supplementation  Hypomagnesemia:  - Supplement as needed  [id number] metoprolol for now, monitor for need to restart. - cortisol a.m. test was checked, was low at 4.4, but patient has been on Decadron few days before that, so this result is inaccurate and  artificially low secondary to steroid treatment), will continue empirically with rapid hydrocortisone taper.  Normocytic anemia: Possibly due to acute illness. Hgb 11.4 on admission, stable at 10.5 without evidence of bleeding. No symptoms of bleeding.    Antimicrobials:  Remdesivir 9/12 - 9/16  Vancomycin  9/12  Flagyl 9/12  Cefepime 9/12 - 9/16  Discharge Condition:  Stable   - Discussed with daughter at time of discharge  Follow UP  [place], [facility] Follow up.   Specialty: [place] Why: A representative from [facility] will contact you to arrange start date and time for your therapy. Contact information: [place] [address] [phone]        Dion Body, MD. Schedule an appointment as soon as possible for a visit in 1 week(s).   Specialty: Family Medicine Contact information: [place] [address] [phone]        Shawna Clamp, MD Follow up.   Specialty: Family Medicine Contact information: [id number] [place] [address] [phone]        Jodi Geralds, MD .   Specialty: Pain  Medicine Contact information: [address] [place] [address] [place] [address] [phone]             Discharge Instructions  and  Discharge Medications     Discharge Instructions    Discharge instructions   Complete by: As directed    Follow with Primary MD Dion Body, MD in 7 days   Get CBC, CMP, checked  by Primary MD next visit.    Activity: As tolerated with Full fall precautions use walker/cane & assistance as needed   Disposition Home   Diet: Heart Healthy ,CARB MODIFIED , with feeding assistance and aspiration precautions.  For Heart failure patients - Check your Weight same time everyday, if you gain over 2 pounds, or you develop in leg swelling, experience more shortness of breath or chest pain, call your Primary MD immediately. Follow Cardiac Low Salt Diet and 1.5 lit/day fluid restriction.   On your next visit with your primary care physician please Get Medicines reviewed and adjusted.   Please request your Prim.MD to go over all Hospital Tests and Procedure/Radiological results at the follow up, please get all Hospital records sent to your Prim MD by signing hospital release before you go home.   If you experience worsening of your admission symptoms, develop shortness of breath, life threatening emergency, suicidal or homicidal thoughts you must seek medical attention immediately by calling 911 or calling your MD immediately  if symptoms less severe.  You Must read complete instructions/literature along with all the possible adverse reactions/side effects for all the Medicines you take and that have been prescribed to you. Take any new Medicines after you have completely understood and accpet all the possible adverse reactions/side effects.   Do not drive, operating heavy machinery, perform activities at heights, swimming or participation in water activities or provide baby sitting services if your were admitted for syncope or siezures until you have seen by  Primary MD or a Neurologist and advised to do so again.  Do not drive when taking Pain medications.    Do not take more than prescribed Pain, Sleep and Anxiety Medications  Special Instructions: If you have smoked or chewed Tobacco  in the last 2 yrs please stop smoking, stop any regular Alcohol  and or any Recreational drug use.  Wear Seat belts while driving.   Please note  You were cared for by a hospitalist during your hospital stay. If you have any questions about your discharge medications or the care you received while you were in the hospital after you are discharged, you can call the  unit and asked to speak with the hospitalist on call if the hospitalist that took care of you is not available. Once you are discharged, your primary care physician will handle any further medical issues. Please note that NO REFILLS for any discharge medications will be authorized once you are discharged, as it is imperative that you return to your primary care physician (or establish a relationship with a primary care physician if you do not have one) for your aftercare needs so that they can reassess your need for medications and monitor your lab values.   Increase activity slowly   Complete by: As directed      Allergies as of 10/13/2018      Reactions   Gadolinium Derivatives Other (See Comments)   Only PO contrast causes confusion and diarrhea   Iodine Diarrhea   Medroxyprogesterone Nausea And Vomiting, Other (See Comments)   Does not want to take hormones   Tape Other (See Comments)   Tears skin - paper tape OK   Codeine Nausea And Vomiting      Medication List    STOP taking these medications   levofloxacin 750 MG tablet Commonly known as: LEVAQUIN   metoprolol succinate 50 MG 24 hr tablet Commonly known as: TOPROL-XL   topiramate 100 MG tablet Commonly known as: TOPAMAX     TAKE these medications   benzonatate 100 MG capsule Commonly known as: TESSALON Take 100 mg by mouth 3  (three) times daily as needed for cough.   Butrans 20 MCG/HR Ptwk patch Generic drug: buprenorphine Place 1 patch onto the skin once a week.   diclofenac sodium 1 % Gel Commonly known as: VOLTAREN Apply 2 g topically 2 (two) times daily as needed (to painful joint).   fluticasone 50 MCG/ACT nasal spray Commonly known as: FLONASE 2 sprays See admin instructions.   glipiZIDE 5 MG tablet Commonly known as: GLUCOTROL Take 0.5 tablets (2.5 mg total) by mouth daily before breakfast. What changed:   medication strength  how much to take  when to take this   hydrocortisone 5 MG tablet Commonly known as: CORTEF 5 mg oral daily for 2 days, then 2.5 mg oral daily for 4 days then stop   MULTIVITAMIN PO Take 1 tablet by mouth daily.   omeprazole 20 MG capsule Commonly known as: PRILOSEC Take 40 mg by mouth daily.   oxybutynin 10 MG 24 hr tablet Commonly known as: DITROPAN-XL Take 10 mg by mouth daily.   pregabalin 150 MG capsule Commonly known as: LYRICA Take 150 mg by mouth 4 (four) times daily.   venlafaxine XR 37.5 MG 24 hr capsule Commonly known as: EFFEXOR-XR Take 37.5 mg by mouth at bedtime.         Diet and Activity recommendation: See Discharge Instructions above   Consults obtained -   Consultants:   PT/OT  Neurology   Major procedures and Radiology Reports - PLEASE review detailed and final reports for all details, in brief -   Ct Head Wo Contrast  Result Date: 10/06/2018 CLINICAL DATA:  Altered mental status with diminished upper extremity dexterity EXAM: CT HEAD WITHOUT CONTRAST TECHNIQUE: Contiguous axial images were obtained from the base of the skull through the vertex without intravenous contrast. COMPARISON:  May 28, 2009 FINDINGS: Brain: Ventricles and sulci are within normal limits for age. There is no intracranial mass, hemorrhage, extra-axial fluid collection, or midline shift. There is slight small vessel disease in the centra semiovale  bilaterally. Elsewhere brain parenchyma appears  unremarkable. No acute infarct is evident. Vascular: There is no appreciable hyperdense vessel. There is no appreciable vascular calcification. Skull: The bony calvarium appears intact. Sinuses/Orbits: There is mucosal thickening in several ethmoid air cells. Other paranasal sinuses are clear. Orbits appear symmetric bilaterally. Other: Visualized mastoid air cells are clear. IMPRESSION: Slight periventricular small vessel disease. No acute infarct. No mass or hemorrhage. There is mucosal thickening in several ethmoid air cells. Electronically Signed   By: Lowella Grip III M.D.   On: 10/06/2018 15:07   Dg Chest Port 1 View  Result Date: 10/03/2018 CLINICAL DATA:  Pneumonia due to COVID-19 virus. EXAM: PORTABLE CHEST 1 VIEW COMPARISON:  Radiograph of October 01, 2018. FINDINGS: Stable cardiomediastinal silhouette. No pneumothorax or pleural effusion is noted. Bilateral lung opacities are noted which are improved compared to prior exam. Bony thorax is unremarkable. IMPRESSION: Decreased bilateral lung opacities are noted consistent with improving pneumonia. Electronically Signed   By: Marijo Conception M.D.   On: 10/03/2018 07:30   Dg Chest Port 1 View  Result Date: 10/01/2018 CLINICAL DATA:  58 year old female with history of cough. EXAM: PORTABLE CHEST 1 VIEW COMPARISON:  Chest x-ray 06/22/2013. FINDINGS: Patchy ill-defined opacities are noted throughout the lungs bilaterally, asymmetrically distributed, most severe throughout the mid to lower lungs. No definite pleural effusions. Heart size is normal. The patient is rotated to the left on today's exam, resulting in distortion of the mediastinal contours and reduced diagnostic sensitivity and specificity for mediastinal pathology. Surgical clips project over the right infrahilar region. IMPRESSION: 1. The appearance the chest is highly concerning for multilobar bilateral pneumonia. Electronically Signed    By: Vinnie Langton M.D.   On: 10/01/2018 10:57    Micro Results     Recent Results (from the past 240 hour(s))  Novel Coronavirus, NAA (hospital order; send-out to ref lab)     Status: Abnormal   Collection Time: 10/09/18  7:31 PM   Specimen: Nasopharyngeal Swab; Respiratory  Result Value Ref Range Status   SARS-CoV-2, NAA Comment (A) NOT DETECTED Final    Comment: (NOTE) Indeterminate We are UNABLE to reliably determine a result for the specimen due to the inconsistent amplification of all of the required SARS-CoV-2 components from the specimen submitted. If clinically indicated, please recollect an additional specimen for testing. This nucleic acid amplification test was developed and its performance characteristics determined by [employer]. Nucleic acid amplification tests include PCR and TMA. This test has not been FDA cleared or approved. This test has been authorized by FDA under an Emergency Use Authorization (EUA). This test is only authorized for the duration of time the declaration that circumstances exist justifying the authorization of the emergency use of in vitro diagnostic tests for detection of SARS-CoV-2 virus and/or diagnosis of COVID-19 infection under section 564(b)(1) of the Act, 21 U.S.C. [id number]) (1), unless the authorization is terminated or revoked sooner. When diagnostic testing is negative,  the possibility of a false negative result should be considered in the context of a patient's recent exposures and the presence of clinical signs and symptoms consistent with COVID-19. An individual without symptoms of COVID- 19 and who is not shedding SARS-CoV-2 virus would expect to have a negative (not detected) result in this assay. Performed At: [facility] [place], [place] [id number] Rush Farmer MD [id number]    [place]  Final    Comment: Performed at [facility] [address], [place], [place] [address]  Novel  Coronavirus, NAA (hospital order; send-out to ref lab)     Status: Abnormal   Collection Time: 10/12/18  7:30 AM   Specimen: Nasopharyngeal Swab; Respiratory  Result Value Ref Range Status   SARS-CoV-2, NAA DETECTED (A) NOT DETECTED Final    Comment: (NOTE)                  Client Requested Flag This nucleic acid amplification test was developed and its performance characteristics determined by [employer]. Nucleic acid amplification tests include PCR and TMA. This test has not been FDA cleared or approved. This test has been authorized by FDA under an Emergency Use Authorization (EUA). This test is only authorized for the duration of time the declaration that circumstances exist justifying the authorization of the emergency use of in vitro diagnostic tests for detection of SARS-CoV-2 virus and/or diagnosis of COVID-19 infection under section 564(b)(1) of the Act, 21 U.S.C. [id number]) (1), unless the authorization is terminated or revoked sooner. When diagnostic testing is negative, the possibility of a false negative result should be considered in the context of a patient's recent exposures and the presence of clinical signs and symptoms consistent with COVID-19. An individual without symptoms of COVID-  19 and who is not shedding SARS-CoV-2 virus would expect to have a negative (not detected) result in this assay. Performed At: [facility] [place], [place] [id number] Rush Farmer MD [id number]    [place]  Final    Comment: Performed at [facility] [address] [place], [place] [address]  SARS CORONAVIRUS 2 (TAT 6-24 HRS) Nasopharyngeal Nasopharyngeal Swab     Status: Abnormal   Collection Time: 10/12/18  1:30 PM   Specimen: Nasopharyngeal Swab  Result Value Ref Range Status   SARS Coronavirus 2 POSITIVE (A) NEGATIVE Final    Comment:  (NOTE) SARS-CoV-2 target nucleic acids are DETECTED. The SARS-CoV-2 RNA is generally detectable in upper and lower respiratory specimens during the acute phase of infection. Positive results are indicative of active infection with SARS-CoV-2. Clinical  correlation with patient history and other diagnostic information is necessary to determine patient infection status. Positive results do  not rule out bacterial infection or co-infection with other viruses. The expected result is Negative. Fact Sheet for Patients: SugarRoll.be Fact Sheet for Healthcare Providers: https://www.woods-mathews.com/ This test is not yet approved or cleared by the [place] FDA and  has been authorized for detection and/or diagnosis of SARS-CoV-2 by FDA under an Emergency Use Authorization (EUA). This EUA will remain  in effect (meaning this test can be used) for the duration of the COVID-19 declaration under Section 564(b)(1) of the Act, 21 U.S.C.  section 360bbb-3(b)(1), unless the authorization is terminated or revoked sooner. Performed at [facility], [place] [address], [place], [place] [address]        Today   Subjective:   Pamela Lewellen today has no headache,no chest or abdominal pain, reports she is feeling much better, and she wants to go home, and not to rehab facility.  Objective:   Blood pressure 126/82, pulse 94, temperature 98.4 F (36.9 C), temperature source Oral, resp. rate (!) 21, height 5\' 7"  (1.702 m), weight 102.1 kg, SpO2 96 %.   Intake/Output Summary (Last 24 hours) at 10/13/2018 1057 Last data filed at 10/13/2018 0452 Gross per 24 hour  Intake 823 ml  Output --  Net 823 ml    Exam Awake Alert, Oriented x 3, No new F.N deficits, Normal  affect.  Symmetrical Chest wall movement, Good air movement bilaterally, CTAB RRR,No Gallops,Rubs or new Murmurs, No Parasternal Heave +ve B.Sounds, Abd Soft, Non tender , No rebound  -guarding or rigidity. No Cyanosis, Clubbing or edema, No new Rash or bruise  Data Review   CBC w Diff:  Lab Results  Component Value Date   WBC 7.0 10/10/2018   HGB 10.5 (L) 10/10/2018   HCT 33.0 (L) 10/10/2018   PLT 302 10/10/2018   LYMPHOPCT 43 10/10/2018   MONOPCT 10 10/10/2018   EOSPCT 1 10/10/2018   BASOPCT 0 10/10/2018    CMP:  Lab Results  Component Value Date   NA 140 10/10/2018   K 3.7 10/10/2018   CL 106 10/10/2018   CO2 27 10/10/2018   BUN 23 (H) 10/10/2018   CREATININE 0.66 10/10/2018   PROT 5.7 (L) 10/10/2018   ALBUMIN 2.8 (L) 10/10/2018   BILITOT 0.7 10/10/2018   ALKPHOS 50 10/10/2018   AST 27 10/10/2018   ALT 53 (H) 10/10/2018  .   Total Time in preparing paper work, data evaluation and todays exam - 35 minutes  Phillips Climes M.D on 10/13/2018 at 10:57 AM  [facility]   Office  [phone]

## 2018-10-13 NOTE — Progress Notes (Signed)
Late Entry for 10/12/18 @ 2009: Patient seen and assessed. Pt resting in bed with eyes closed. Opens eyes when name is called. Physical assessment completed via computerized charting per [facility] policy. No acute distress noted. No family present at bedside. Side rails up times 2. Bed in lowest position and locked. Call light in reach. Continue to monitor

## 2018-10-13 NOTE — Progress Notes (Signed)
SATURATION QUALIFICATIONS: (This note is used to comply with regulatory documentation for home oxygen)  Patient Saturations on Room Air at Rest = 100%  Patient Saturations on Room Air while Ambulating = 97%  Patient Saturations on  Liters of oxygen while Ambulating = n/a

## 2018-10-13 NOTE — Discharge Instructions (Signed)
Person Under Monitoring Name: Gina Hill  Location: [place] [address]   Infection Prevention Recommendations for Individuals Confirmed to have, or Being Evaluated for, 2019 Novel Coronavirus (COVID-19) Infection Who Receive Care at Home  Individuals who are confirmed to have, or are being evaluated for, COVID-19 should follow the prevention steps below until a healthcare provider or local or state health department says they can return to normal activities.  Stay home except to get medical care You should restrict activities outside your home, except for getting medical care. Do not go to work, school, or public areas, and do not use public transportation or taxis.  Call ahead before visiting your doctor Before your medical appointment, call the healthcare provider and tell them that you have, or are being evaluated for, COVID-19 infection. This will help the healthcare providers office take steps to keep other people from getting infected. Ask your healthcare provider to call the local or state health department.  Monitor your symptoms Seek prompt medical attention if your illness is worsening (e.g., difficulty breathing). Before going to your medical appointment, call the healthcare provider and tell them that you have, or are being evaluated for, COVID-19 infection. Ask your healthcare provider to call the local or state health department.  Wear a facemask You should wear a facemask that covers your nose and mouth when you are in the same room with other people and when you visit a healthcare provider. People who live with or visit you should also wear a facemask while they are in the same room with you.  Separate yourself from other people in your home As much as possible, you should stay in a different room from other people in your home. Also, you should use a separate bathroom, if available.  Avoid sharing household items You should not share  dishes, drinking glasses, cups, eating utensils, towels, bedding, or other items with other people in your home. After using these items, you should wash them thoroughly with soap and water.  Cover your coughs and sneezes Cover your mouth and nose with a tissue when you cough or sneeze, or you can cough or sneeze into your sleeve. Throw used tissues in a lined trash can, and immediately wash your hands with soap and water for at least 20 seconds or use an alcohol-based hand rub.  Wash your [facility] your hands often and thoroughly with soap and water for at least 20 seconds. You can use an alcohol-based hand sanitizer if soap and water are not available and if your hands are not visibly dirty. Avoid touching your eyes, nose, and mouth with unwashed hands.   Prevention Steps for Caregivers and Household Members of Individuals Confirmed to have, or Being Evaluated for, COVID-19 Infection Being Cared for in the Home  If you live with, or provide care at home for, a person confirmed to have, or being evaluated for, COVID-19 infection please follow these guidelines to prevent infection:  Follow healthcare providers instructions Make sure that you understand and can help the patient follow any healthcare provider instructions for all care.  Provide for the patients basic needs You should help the patient with basic needs in the home and provide support for getting groceries, prescriptions, and other personal needs.  Monitor the patients symptoms If they are getting sicker, call his or her medical provider and tell them that the patient has, or is being evaluated for, COVID-19 infection. This will help the healthcare providers office take  steps to keep other people from getting infected. Ask the healthcare provider to call the local or state health department.  Limit the number of people who have contact with the patient  If possible, have only one caregiver for the patient.  Other  household members should stay in another home or place of residence. If this is not possible, they should stay  in another room, or be separated from the patient as much as possible. Use a separate bathroom, if available.  Restrict visitors who do not have an essential need to be in the home.  Keep older adults, very young children, and other sick people away from the patient Keep older adults, very young children, and those who have compromised immune systems or chronic health conditions away from the patient. This includes people with chronic heart, lung, or kidney conditions, diabetes, and cancer.  Ensure good ventilation Make sure that shared spaces in the home have good air flow, such as from an air conditioner or an opened window, weather permitting.  Wash your hands often  Wash your hands often and thoroughly with soap and water for at least 20 seconds. You can use an alcohol based hand sanitizer if soap and water are not available and if your hands are not visibly dirty.  Avoid touching your eyes, nose, and mouth with unwashed hands.  Use disposable paper towels to dry your hands. If not available, use dedicated cloth towels and replace them when they become wet.  Wear a facemask and gloves  Wear a disposable facemask at all times in the room and gloves when you touch or have contact with the patients blood, body fluids, and/or secretions or excretions, such as sweat, saliva, sputum, nasal mucus, vomit, urine, or feces.  Ensure the mask fits over your nose and mouth tightly, and do not touch it during use.  Throw out disposable facemasks and gloves after using them. Do not reuse.  Wash your hands immediately after removing your facemask and gloves.  If your personal clothing becomes contaminated, carefully remove clothing and launder. Wash your hands after handling contaminated clothing.  Place all used disposable facemasks, gloves, and other waste in a lined container before  disposing them with other household waste.  Remove gloves and wash your hands immediately after handling these items.  Do not share dishes, glasses, or other household items with the patient  Avoid sharing household items. You should not share dishes, drinking glasses, cups, eating utensils, towels, bedding, or other items with a patient who is confirmed to have, or being evaluated for, COVID-19 infection.  After the person uses these items, you should wash them thoroughly with soap and water.  Wash laundry thoroughly  Immediately remove and wash clothes or bedding that have blood, body fluids, and/or secretions or excretions, such as sweat, saliva, sputum, nasal mucus, vomit, urine, or feces, on them.  Wear gloves when handling laundry from the patient.  Read and follow directions on labels of laundry or clothing items and detergent. In general, wash and dry with the warmest temperatures recommended on the label.  Clean all areas the individual has used often  Clean all touchable surfaces, such as counters, tabletops, doorknobs, bathroom fixtures, toilets, phones, keyboards, tablets, and bedside tables, every day. Also, clean any surfaces that may have blood, body fluids, and/or secretions or excretions on them.  Wear gloves when cleaning surfaces the patient has come in contact with.  Use a diluted bleach solution (e.g., dilute bleach with 1 part bleach  and 10 parts water) or a household disinfectant with a label that says EPA-registered for coronaviruses. To make a bleach solution at home, add 1 tablespoon of bleach to 1 quart (4 cups) of water. For a larger supply, add  cup of bleach to 1 gallon (16 cups) of water.  Read labels of cleaning products and follow recommendations provided on product labels. Labels contain instructions for safe and effective use of the cleaning product including precautions you should take when applying the product, such as wearing gloves or eye protection  and making sure you have good ventilation during use of the product.  Remove gloves and wash hands immediately after cleaning.  Monitor yourself for signs and symptoms of illness Caregivers and household members are considered close contacts, should monitor their health, and will be asked to limit movement outside of the home to the extent possible. Follow the monitoring steps for close contacts listed on the symptom monitoring form.   ? If you have additional questions, contact your local health department or call the epidemiologist on call at [phone] (available 24/7). ? This guidance is subject to change. For the most up-to-date guidance from [facility], please refer to their website: [email or web address]

## 2018-10-13 NOTE — TOC Transition Note (Signed)
Transition of Care [facility]) - CM/SW Discharge Note   Patient Details  Name: Gina Hill MRN: [id number] Date of Birth: 1960/04/30  Transition of Care [facility]) CM/SW Contact:  Ninfa Meeker, RN Phone Number: [phone] (working remotely) 10/13/2018, 10:56 AM   Clinical Narrative:   Case manager spoke with patient's daughter, Gina Hill, concerning discharge plan. She is in agreement with the previously arranged [place]. CM contacted Gina Hill with [facility] to update him on patient's discharge plan. Gina Hill is on her way from [place] to get her mom. No further needs identified.      Final next level of care: [place]     Patient Goals and CMS Choice Patient states their goals for this hospitalization and ongoing recovery are:: daughter wants mom to get better CMS Medicare.gov Compare Post Acute Care list provided to:: Patient Represenative (must comment)(daughter) Choice offered to / list presented to : Adult Children  Discharge Placement                       Discharge Plan and Services   Discharge Planning Services: CM Consult Post Acute Care Choice: Home Health          DME Arranged: N/A         HH Arranged: PT, OT, Social Work [employer] Agency: [place] Date [place]: 10/05/18 Time [place]: 1632 Representative spoke with at [place]: Gina Hill  Social Determinants of Health ([place]) Interventions     Readmission Risk Interventions No flowsheet data found.

## 2021-03-11 ENCOUNTER — Emergency Department (HOSPITAL_COMMUNITY)
Admission: EM | Admit: 2021-03-11 | Discharge: 2021-03-12 | Disposition: A | Discharge status: Home / Self Care | Payer: BC Managed Care – PPO | Attending: Emergency Medicine | Admitting: Emergency Medicine

## 2021-03-11 ENCOUNTER — Encounter (HOSPITAL_COMMUNITY): Payer: Self-pay | Admitting: Emergency Medicine

## 2021-03-11 ENCOUNTER — Other Ambulatory Visit: Payer: Self-pay

## 2021-03-11 ENCOUNTER — Emergency Department (HOSPITAL_COMMUNITY): Payer: BC Managed Care – PPO

## 2021-03-11 DIAGNOSIS — J45909 Unspecified asthma, uncomplicated: Secondary | ICD-10-CM | POA: Insufficient documentation

## 2021-03-11 DIAGNOSIS — I10 Essential (primary) hypertension: Secondary | ICD-10-CM | POA: Insufficient documentation

## 2021-03-11 DIAGNOSIS — R0789 Other chest pain: Secondary | ICD-10-CM | POA: Diagnosis not present

## 2021-03-11 DIAGNOSIS — E119 Type 2 diabetes mellitus without complications: Secondary | ICD-10-CM | POA: Insufficient documentation

## 2021-03-11 DIAGNOSIS — M79602 Pain in left arm: Secondary | ICD-10-CM | POA: Diagnosis not present

## 2021-03-11 DIAGNOSIS — Z853 Personal history of malignant neoplasm of breast: Secondary | ICD-10-CM | POA: Insufficient documentation

## 2021-03-11 LAB — CBC
HCT: 40 % (ref 36.0–46.0)
Hemoglobin: 13.5 g/dL (ref 12.0–15.0)
MCH: 32.3 pg (ref 26.0–34.0)
MCHC: 33.8 g/dL (ref 30.0–36.0)
MCV: 95.7 fL (ref 80.0–100.0)
Platelets: 243 10*3/uL (ref 150–400)
RBC: 4.18 MIL/uL (ref 3.87–5.11)
RDW: 12.7 % (ref 11.5–15.5)
WBC: 10.6 10*3/uL — ABNORMAL HIGH (ref 4.0–10.5)
nRBC: 0 % (ref 0.0–0.2)

## 2021-03-11 MED ORDER — HYDROMORPHONE HCL 1 MG/ML IJ SOLN
1.0000 mg | Freq: Once | INTRAMUSCULAR | Status: AC
  Administered 2021-03-11: 1 mg via INTRAVENOUS
  Filled 2021-03-11: qty 1

## 2021-03-11 NOTE — ED Provider Notes (Signed)
[facility] Emergency Department Provider Note MRN:  [id number]  Arrival date & time: 03/12/21     Chief Complaint   Pain   History of Present Illness   Gina Hill is a 61 y.o. year-old female with a history of cancer, chronic pain, diabetes presenting to the ED with chief complaint of pain.  Patient has a history of chronic pain "all over".  She is currently experiencing severe pain to her left arm radiating into her left chest.  Explains that she has never had this type of pain before.    Review of Systems  A thorough review of systems was obtained and all systems are negative except as noted in the HPI and PMH.   Patient's Health History    Past Medical History:  Diagnosis Date   Arthritis    Asthma    Cancer ([place])    right breast   Chronic back pain    Chronic pelvic pain in female    Complication of anesthesia    SOB after anesthesia so " I usually have to get a breathing treatment after anesthesia."   Diabetes mellitus without complication (HCC)    Endometriosis    GERD (gastroesophageal reflux disease)    Hypercholesterolemia    Hyperparathyroidism ([place])    Hypertension    Neuropathic pain    Obese     Past Surgical History:  Procedure Laterality Date   BREAST BIOPSY     Right   CESAREAN SECTION     FOOT SURGERY     Left foot   LAPAROSCOPIC LYSIS OF ADHESIONS     LAPAROTOMY     PORTACATH PLACEMENT Left 06/22/2013   Procedure: INSERTION PORT-A-CATH;  Surgeon: Merrie Roof, MD;  Location: [facility] OR;  Service: General;  Laterality: Left;   SALPINGECTOMY     Partial Right     WISDOM TOOTH EXTRACTION      Family History  Problem Relation Age of Onset   Hypertension Mother    Mental illness Mother    Heart disease Mother    Diabetes Father    Hypertension Father    Cancer Father        prostate   Kidney disease Father    Breast cancer Maternal Aunt    Cancer Maternal Aunt        kidney    Social History   Socioeconomic  History   Marital status: Married    Spouse name: Not on file   Number of children: Not on file   Years of education: Not on file   Highest education level: Not on file  Occupational History   Not on file  Tobacco Use   Smoking status: Never   Smokeless tobacco: Never  Substance and Sexual Activity   Alcohol use: No   Drug use: Never    Types: Oxycodone   Sexual activity: Not Currently    Birth control/protection: None  Other Topics Concern   Not on file  Social History Narrative   Not on file   Social Determinants of Health   Financial Resource Strain: Not on file  Food Insecurity: Not on file  Transportation Needs: Not on file  Physical Activity: Not on file  Stress: Not on file  Social Connections: Not on file  Intimate Partner Violence: Not on file     Physical Exam   Vitals:   03/11/21 2300 03/11/21 2330  BP: (!) 173/94   Pulse: 81 92  Resp: 16 (!) 23  Temp:    SpO2: 100% 97%    CONSTITUTIONAL: Well-appearing, in moderate distress due to pain NEURO/PSYCH:  Alert and oriented x 3, no focal deficits EYES:  eyes equal and reactive ENT/NECK:  no LAD, no JVD CARDIO: Regular rate, well-perfused, normal S1 and S2 PULM:  CTAB no wheezing or rhonchi GI/GU:  non-distended, non-tender MSK/SPINE:  No gross deformities, no edema SKIN:  no rash, atraumatic   *Additional and/or pertinent findings included in MDM below  Diagnostic and Interventional Summary    EKG Interpretation  Date/Time:  Tuesday March 11 2021 22:51:38 EST Ventricular Rate:  83 PR Interval:  243 QRS Duration: 114 QT Interval:  352 QTC Calculation: 414 R Axis:   -38 Text Interpretation: Sinus rhythm Prolonged PR interval LVH with IVCD, LAD and secondary repol abnrm Confirmed by Gerlene Fee [phone]) on 03/11/2021 11:45:57 PM       Labs Reviewed  CBC - Abnormal; Notable for the following components:      Result Value   WBC 10.6 (*)    All other components within normal limits   COMPREHENSIVE METABOLIC PANEL - Abnormal; Notable for the following components:   Potassium 3.4 (*)    Glucose, Bld 170 (*)    Calcium 10.4 (*)    All other components within normal limits  TROPONIN I (HIGH SENSITIVITY)  TROPONIN I (HIGH SENSITIVITY)    DG Chest Port 1 View  Final Result      Medications  HYDROmorphone (DILAUDID) injection 1 mg (1 mg Intravenous Given 03/11/21 2318)     Procedures  /  Critical Care Procedures  ED Course and Medical Decision Making  Initial Impression and Ddx Suspect a chronic pain presentation however patient says this is different pain and therefore ACS is considered given the location.  Doubt PE.  Patient has a pan positive review of systems no matter what question I asked.  Past medical/surgical history that increases complexity of ED encounter: Chronic pain  Interpretation of Diagnostics I personally reviewed the EKG and my interpretation is as follows: Sinus rhythm without ischemic finding    Labs are reassuring with no significant blood count or electrolyte disturbance.  Troponin is negative  Patient Reassessment and Ultimate Disposition/Management On reassessment patient is feeling much better.  Doubt cardiopulmonary cause, question MSK or chronic pain presentation.  Limb is neurovascularly intact, nothing to suggest a vascular etiology.  No indication for further testing or admission, appropriate for discharge  Patient management required discussion with the following services or consulting groups:  None  Complexity of Problems Addressed Acute illness or injury that poses threat of life of bodily function  Additional Data Reviewed and Analyzed Further history obtained from: Further history from spouse/family member  Additional Factors Impacting ED Encounter Risk Prescriptions  Barth Kirks. [place] Small, [place] [email or web address]  Final Clinical Impressions(s) / ED Diagnoses      ICD-10-CM   1. Pain of left upper extremity  M79.602       ED Discharge Orders          Ordered    methocarbamol (ROBAXIN) 500 MG tablet  Every 8 hours PRN        03/12/21 0051    lidocaine (LIDODERM) 5 %  Every 24 hours        03/12/21 0051             Discharge Instructions Discussed with and Provided to Patient:     Discharge Instructions  You were evaluated in the Emergency Department and after careful evaluation, we did not find any emergent condition requiring admission or further testing in the hospital.  Your exam/testing today was overall reassuring.  No evidence of heart damage or heart attack.  Symptoms may be due to muscular strain or spasm.  Can use the numbing patches or Robaxin as needed.  Recommend follow-up with your pain specialist.  Please return to the Emergency Department if you experience any worsening of your condition.  Thank you for allowing [place] to be a part of your care.        Maudie Flakes, MD 03/12/21 [phone]

## 2021-03-11 NOTE — ED Triage Notes (Signed)
Pt c/o pain to the left side of her body that started today. Pt states she has history of chronic pain.

## 2021-03-12 LAB — COMPREHENSIVE METABOLIC PANEL
ALT: 18 U/L (ref 0–44)
AST: 21 U/L (ref 15–41)
Albumin: 4 g/dL (ref 3.5–5.0)
Alkaline Phosphatase: 76 U/L (ref 38–126)
Anion gap: 8 (ref 5–15)
BUN: 16 mg/dL (ref 6–20)
CO2: 23 mmol/L (ref 22–32)
Calcium: 10.4 mg/dL — ABNORMAL HIGH (ref 8.9–10.3)
Chloride: 104 mmol/L (ref 98–111)
Creatinine, Ser: 0.79 mg/dL (ref 0.44–1.00)
GFR, Estimated: >60 mL/min (ref >60)
Glucose, Bld: 170 mg/dL — ABNORMAL HIGH (ref 70–99)
Potassium: 3.4 mmol/L — ABNORMAL LOW (ref 3.5–5.1)
Sodium: 135 mmol/L (ref 135–145)
Total Bilirubin: 0.6 mg/dL (ref 0.3–1.2)
Total Protein: 7.6 g/dL (ref 6.5–8.1)

## 2021-03-12 LAB — TROPONIN I (HIGH SENSITIVITY): Troponin I (High Sensitivity): 12 ng/L (ref <18)

## 2021-03-12 MED ORDER — METHOCARBAMOL 500 MG PO TABS: 500.0000 mg | ORAL_TABLET | Freq: Three times a day (TID) | ORAL | 0 refills | Status: DC | PRN

## 2021-03-12 MED ORDER — LIDOCAINE 5 % EX PTCH: 1.0000 | MEDICATED_PATCH | CUTANEOUS | 0 refills | Status: DC

## 2021-03-12 NOTE — Discharge Instructions (Addendum)
You were evaluated in the Emergency Department and after careful evaluation, we did not find any emergent condition requiring admission or further testing in the hospital.  Your exam/testing today was overall reassuring.  No evidence of heart damage or heart attack.  Symptoms may be due to muscular strain or spasm.  Can use the numbing patches or Robaxin as needed.  Recommend follow-up with your pain specialist.  Please return to the Emergency Department if you experience any worsening of your condition.  Thank you for allowing [place] to be a part of your care.

## 2021-05-22 ENCOUNTER — Encounter: Payer: Self-pay | Admitting: Internal Medicine

## 2021-06-25 ENCOUNTER — Ambulatory Visit: Payer: Medicare Other | Admitting: Internal Medicine

## 2021-06-26 ENCOUNTER — Ambulatory Visit: Payer: Medicare Other | Admitting: Gastroenterology

## 2021-08-27 ENCOUNTER — Ambulatory Visit (INDEPENDENT_AMBULATORY_CARE_PROVIDER_SITE_OTHER): Payer: BC Managed Care – PPO | Admitting: Internal Medicine

## 2021-08-27 ENCOUNTER — Other Ambulatory Visit: Payer: Self-pay | Admitting: *Deleted

## 2021-08-27 ENCOUNTER — Encounter: Payer: Self-pay | Admitting: Internal Medicine

## 2021-08-27 VITALS — BP 146/91 | HR 91 | Ht 67.0 in | Wt 205.6 lb

## 2021-08-27 DIAGNOSIS — K8689 Other specified diseases of pancreas: Secondary | ICD-10-CM | POA: Diagnosis not present

## 2021-08-27 DIAGNOSIS — K581 Irritable bowel syndrome with constipation: Secondary | ICD-10-CM | POA: Diagnosis not present

## 2021-08-27 DIAGNOSIS — R1013 Epigastric pain: Secondary | ICD-10-CM

## 2021-08-27 NOTE — Patient Instructions (Signed)
I will order CT of your pancreas to evaluate your abdominal pain.   I am going to give you samples of Linzess 145 mcg daily to take for your chronic constipation. Lets [place] know next week how you are doing and if improved, I will send in formal prescription. We have room to either decrease or increase dosage as needed.   Follow up with GI in 6-8 weeks  It was nice seeing you again today.  Dr. Abbey Chatters
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
## 2021-08-28 ENCOUNTER — Telehealth: Payer: Self-pay | Admitting: *Deleted

## 2021-08-28 ENCOUNTER — Encounter: Payer: Self-pay | Admitting: *Deleted

## 2021-08-28 NOTE — Telephone Encounter (Signed)
Patient informed of CT scheduled for 09/08/21 at [facility], arrive at 8:45 am. Nothing to eat 4 hours prior, may have liquids. Also sent MyChart message to pt regarding appointment.

## 2021-09-01 ENCOUNTER — Telehealth: Payer: Self-pay

## 2021-09-01 NOTE — Telephone Encounter (Signed)
Pt called stating that the Linzess 145 has stopped working and she is still having pain. Pt no longer has any samples. Pt thinks it may need to be increased. Please advise.

## 2021-09-03 NOTE — Telephone Encounter (Signed)
PA approved via carelon.  Order ID: [id number]       Authorized  Approval Valid Through: 09/03/2021 - 10/02/2021

## 2021-09-05 ENCOUNTER — Other Ambulatory Visit: Payer: Self-pay | Admitting: Internal Medicine

## 2021-09-05 MED ORDER — LINACLOTIDE 290 MCG PO CAPS: 290.0000 ug | ORAL_CAPSULE | Freq: Every day | ORAL | 3 refills | Status: DC

## 2021-09-05 NOTE — Telephone Encounter (Signed)
Linzess 290 mcg sent to pharmacy.  Thank you

## 2021-09-08 ENCOUNTER — Ambulatory Visit (HOSPITAL_COMMUNITY): Admission: RE | Admit: 2021-09-08 | Payer: Medicare Other | Source: Ambulatory Visit

## 2021-09-08 NOTE — Telephone Encounter (Signed)
Pt was made aware and verbalized understanding.  

## 2021-09-25 ENCOUNTER — Ambulatory Visit (HOSPITAL_COMMUNITY)
Admission: RE | Admit: 2021-09-25 | Discharge: 2021-09-25 | Disposition: A | Discharge status: Home / Self Care | Payer: BC Managed Care – PPO | Source: Ambulatory Visit | Attending: Internal Medicine | Admitting: Internal Medicine

## 2021-09-25 DIAGNOSIS — K8689 Other specified diseases of pancreas: Secondary | ICD-10-CM | POA: Diagnosis not present

## 2021-09-25 LAB — POCT I-STAT CREATININE: Creatinine, Ser: 0.8 mg/dL (ref 0.44–1.00)

## 2021-09-25 MED ORDER — IOHEXOL 300 MG/ML  SOLN
100.0000 mL | Freq: Once | INTRAMUSCULAR | Status: AC | PRN
  Administered 2021-09-25: 100 mL via INTRAVENOUS

## 2021-10-01 ENCOUNTER — Telehealth: Payer: Self-pay | Admitting: *Deleted

## 2021-10-01 NOTE — Telephone Encounter (Signed)
Pt called and states she had CT done on 09/26/2021 and would like results of the scan.

## 2021-10-02 ENCOUNTER — Other Ambulatory Visit: Payer: Self-pay | Admitting: Internal Medicine

## 2021-10-02 DIAGNOSIS — K838 Other specified diseases of biliary tract: Secondary | ICD-10-CM

## 2021-10-02 DIAGNOSIS — G8929 Other chronic pain: Secondary | ICD-10-CM

## 2021-10-07 ENCOUNTER — Telehealth: Payer: Self-pay | Admitting: Internal Medicine

## 2021-10-07 NOTE — Telephone Encounter (Signed)
Dr. Abbey Chatters did agree to the pt being seen for a video visit

## 2021-10-07 NOTE — Telephone Encounter (Signed)
Called patient to make aware of appointment location tomorrow and she said "no this is a virtual visit".  The appointment note states fu ov - she said she spoke with Dr. Abbey Chatters last week and he said he would do the virtual visit with her, that is what she is saying.  She is out of town and unsure when she would be back and wants a virtual visit but only sees Dr. Abbey Chatters only.  Please advise.

## 2021-10-08 ENCOUNTER — Ambulatory Visit: Payer: Medicare Other | Admitting: Internal Medicine

## 2021-11-13 ENCOUNTER — Telehealth: Payer: Self-pay

## 2021-11-13 ENCOUNTER — Telehealth (INDEPENDENT_AMBULATORY_CARE_PROVIDER_SITE_OTHER): Payer: BC Managed Care – PPO | Admitting: Internal Medicine

## 2021-11-13 ENCOUNTER — Encounter: Payer: Self-pay | Admitting: Internal Medicine

## 2021-11-13 VITALS — Ht 67.0 in | Wt 210.0 lb

## 2021-11-13 DIAGNOSIS — K581 Irritable bowel syndrome with constipation: Secondary | ICD-10-CM

## 2021-11-13 DIAGNOSIS — R1013 Epigastric pain: Secondary | ICD-10-CM

## 2021-11-13 DIAGNOSIS — G8929 Other chronic pain: Secondary | ICD-10-CM | POA: Diagnosis not present

## 2021-11-13 DIAGNOSIS — R112 Nausea with vomiting, unspecified: Secondary | ICD-10-CM | POA: Diagnosis not present

## 2021-11-13 MED ORDER — NALOXEGOL OXALATE 25 MG PO TABS: 25.0000 mg | ORAL_TABLET | Freq: Every day | ORAL | 11 refills | Status: AC

## 2021-11-13 NOTE — Telephone Encounter (Signed)
error 

## 2021-11-13 NOTE — Progress Notes (Signed)
Referring Provider:  Primary Care Physician:  Jetta Lout  Primary GI:   Patient Location: Home   Provider Location: [place] office   Reason for Visit: Follow up     Total time (minutes) spent on medical discussion: >21 minutes   Due to COVID-19, visit was conducted using virtual method.  Visit was requested by patient.  Virtual Visit via MyChart Video Note Due to COVID-19, visit is conducted virtually and was requested by patient.   I connected with Gina Hill on 62/95/28 at  3:00 PM EDT by video and verified that I am speaking with the correct person using two identifiers.   I discussed the limitations, risks, security and privacy concerns of performing an evaluation and management service by video and the availability of in person appointments. I also discussed with the patient that there may be a patient responsible charge related to this service. The patient expressed understanding and agreed to proceed.  Chief Complaint  Patient presents with   Follow-up    Follow up on CT     History of Present Illness: Gina Hill is a pleasant 61 year old female who presents for virtual visit today for follow-up visit.  Previously seen August for chronic epigastric pain.  Mild to moderate in severity, does not radiate.  Also associated nausea, occasional vomiting.  No melena hematochezia.  CT abdomen pelvis with contrast 02/27/2020 showed punctate pancreatic calcifications may be sequela of prior inflammation, also with mild dilatation of the pancreatic duct measuring 5 mm in diameter.  No obstructing stone or mass.  No previous EUS.   She underwent repeat CT pancreas protocol on 09/25/2021 which showed normal pancreas, no acute process, minimal common duct dilatation for age similar to 2020, most likely normal variation.  Also with chronic constipation and chronic opioid use.  Trial of Linzess which did not help.  Last colonoscopy 2019, she is unsure of results.  Past Medical  History:  Diagnosis Date   Arthritis    Asthma    Cancer ([place])    right breast   Chronic back pain    Chronic pelvic pain in female    Complication of anesthesia    SOB after anesthesia so " I usually have to get a breathing treatment after anesthesia."   Diabetes mellitus without complication (HCC)    Endometriosis    GERD (gastroesophageal reflux disease)    Hypercholesterolemia    Hyperparathyroidism ([place])    Hypertension    Neuropathic pain    Obese      Past Surgical History:  Procedure Laterality Date   BREAST BIOPSY     Right   CESAREAN SECTION     FOOT SURGERY     Left foot   LAPAROSCOPIC LYSIS OF ADHESIONS     LAPAROTOMY     PORTACATH PLACEMENT Left 06/22/2013   Procedure: INSERTION PORT-A-CATH;  Surgeon: Luella Cook III, MD;  Location: [facility];  Service: General;  Laterality: Left;   SALPINGECTOMY     Partial Right     WISDOM TOOTH EXTRACTION       Current Meds  Medication Sig   diclofenac sodium (VOLTAREN) 1 % GEL Apply 2 g topically 2 (two) times daily as needed (to painful joint).   metoprolol succinate (TOPROL-XL) 50 MG 24 hr tablet Take 50 mg by mouth daily. Take with or immediately following a meal.   Multiple Vitamins-Minerals (MULTIVITAMIN PO) Take 1 tablet by mouth daily.   pregabalin (LYRICA) 150 MG capsule Take 150 mg by  mouth 4 (four) times daily.   tiZANidine (ZANAFLEX) 2 MG tablet Take 2 mg by mouth at bedtime.   TRULICITY 1.5 PP/2.9JJ SOPN Inject 1.5 mg into the skin once a week.     Family History  Problem Relation Age of Onset   Hypertension Mother    Mental illness Mother    Heart disease Mother    Diabetes Father    Hypertension Father    Cancer Father        prostate   Kidney disease Father    Breast cancer Maternal Aunt    Cancer Maternal Aunt        kidney    Social History   Socioeconomic History   Marital status: Married    Spouse name: Not on file   Number of children: Not on file   Years of education: Not on file    Highest education level: Not on file  Occupational History   Not on file  Tobacco Use   Smoking status: Never   Smokeless tobacco: Never  Vaping Use   Vaping Use: Never used  Substance and Sexual Activity   Alcohol use: No   Drug use: Never    Types: Oxycodone   Sexual activity: Not Currently    Birth control/protection: None  Other Topics Concern   Not on file  Social History Narrative   Not on file   Social Determinants of Health   Financial Resource Strain: Not on file  Food Insecurity: Not on file  Transportation Needs: Not on file  Physical Activity: Not on file  Stress: Not on file  Social Connections: Not on file       Review of Systems: Gen: Denies fever, chills, anorexia. Denies fatigue, weakness, weight loss.  CV: Denies chest pain, palpitations, syncope, peripheral edema, and claudication. Resp: Denies dyspnea at rest, cough, wheezing, coughing up blood, and pleurisy. GI: see HPI Derm: Denies rash, itching, dry skin Psych: Denies depression, anxiety, memory loss, confusion. No homicidal or suicidal ideation.  Heme: Denies bruising, bleeding, and enlarged lymph nodes.  Observations/Objective: No distress. Unable to perform physical exam due to video encounter.   Assessment: *Nausea and vomiting *Epigastric pain-chronic *Abnormal CT of the pancreas-pancreatic calcification pancreatic ductal dilatation *Irritable bowel syndrome with constipation, opioid-induced constipation   Plan: Will schedule for EGD to evaluate for peptic ulcer disease, esophagitis, gastritis, H. Pylori, duodenitis, or other. Will also evaluate for esophageal stricture, Schatzki's ring, esophageal web or other. The risks including infection, bleed, or perforation as well as benefits, limitations, alternatives and imponderables have been reviewed with the patient. Potential for esophageal dilation, biopsy, etc. have also been reviewed.  Questions have been answered. All parties  agreeable.  Trulicity may be playing a role in patient's symptoms.  Request colonoscopy report from [facility], will trial on Movantik and see how she does.  Follow-up after procedures.  Follow Up Instructions:    I discussed the assessment and treatment plan with the patient. The patient was provided an opportunity to ask questions and all were answered. The patient agreed with the plan and demonstrated an understanding of the instructions.   The patient was advised to call back or seek an in-person evaluation if the symptoms worsen or if the condition fails to improve as anticipated.  I provided >21 minutes of face-to-face time during this MyChart Video encounter.  Annitta Needs, PhD, ANP-BC [facility]

## 2021-11-14 ENCOUNTER — Telehealth: Payer: Self-pay | Admitting: *Deleted

## 2021-11-14 NOTE — Telephone Encounter (Signed)
Need to schedule EGD ASA 3 with Dr. Abbey Chatters   [facility]

## 2021-11-17 ENCOUNTER — Telehealth: Payer: Self-pay | Admitting: Internal Medicine

## 2021-11-17 NOTE — Telephone Encounter (Signed)
Patient left a message that she was returning a call but she didn't know who had called her.... [phone]

## 2021-11-18 ENCOUNTER — Encounter: Payer: Self-pay | Admitting: *Deleted

## 2021-11-18 NOTE — Telephone Encounter (Signed)
Pt has been scheduled for 11/24/21 at 8:45 am. Instructions have been sent via [place].

## 2021-11-18 NOTE — Telephone Encounter (Signed)
See previous TE

## 2021-11-20 NOTE — Patient Instructions (Signed)
Gina Hill  [birth year]     '@PREFPERIOPPHARMACY'$ @   Your procedure is scheduled on Nov 6 @ 0700.  Report to [facility] at 0700 A.M.  Call this number if you have problems the morning of surgery:  [phone]  If you experience any cold or flu symptoms such as cough, fever, chills, shortness of breath, etc. between now and your scheduled surgery, please notify us at the above number.   Remember:  Do not eat or drink after midnight.   Take these medicines the morning of surgery with A SIP OF WATER metoprolol, lyrica and movantik    Do not wear jewelry, make-up or nail polish.  Do not wear lotions, powders, or perfumes, or deodorant.  Do not shave 48 hours prior to surgery.  Men may shave face and neck.  Do not bring valuables to the hospital.  [facility] is not responsible for any belongings or valuables.  Contacts, dentures or bridgework may not be worn into surgery.  Leave your suitcase in the car.  After surgery it may be brought to your room.  For patients admitted to the hospital, discharge time will be determined by your treatment team.  Patients discharged the day of surgery will not be allowed to drive home.   Name and phone number of your driver:   family Special instructions:  Follow instructions given to you by the office.  Please read over the following fact sheets that you were given. Anesthesia Post-op Instructions and Care and Recovery After Surgery\    Upper Endoscopy, Adult, Care After After the procedure, it is common to have a sore throat. It is also common to have: Mild stomach pain or discomfort. Bloating. Nausea. Follow these instructions at home: The instructions below may help you care for yourself at home. Your health care provider may give you more instructions. If you have questions, ask your health care provider. If you were given a sedative during the procedure, it can affect you for several hours. Do not drive or operate machinery until  your health care provider says that it is safe. If you will be going home right after the procedure, plan to have a responsible adult: Take you home from the hospital or clinic. You will not be allowed to drive. Care for you for the time you are told. Follow instructions from your health care provider about what you may eat and drink. Return to your normal activities as told by your health care provider. Ask your health care provider what activities are safe for you. Take over-the-counter and prescription medicines only as told by your health care provider. Contact a health care provider if you: Have a sore throat that lasts longer than one day. Have trouble swallowing. Have a fever. Get help right away if you: Vomit blood or your vomit looks like coffee grounds. Have bloody, black, or tarry stools. Have a very bad sore throat or you cannot swallow. Have difficulty breathing or very bad pain in your chest or abdomen. These symptoms may be an emergency. Get help right away. Call 911. Do not wait to see if the symptoms will go away. Do not drive yourself to the hospital. Summary After the procedure, it is common to have a sore throat, mild stomach discomfort, bloating, and nausea. If you were given a sedative during the procedure, it can affect you for several hours. Do not drive until your health care provider says that it is safe. Follow instructions from your health care  provider about what you may eat and drink. Return to your normal activities as told by your health care provider. This information is not intended to replace advice given to you by your health care provider. Make sure you discuss any questions you have with your health care provider. Document Revised: 04/16/2021 Document Reviewed: 04/16/2021 Elsevier Patient Education  [place].

## 2021-11-21 ENCOUNTER — Encounter (HOSPITAL_COMMUNITY)
Admission: RE | Admit: 2021-11-21 | Discharge: 2021-11-21 | Disposition: A | Discharge status: Home / Self Care | Payer: BC Managed Care – PPO | Source: Ambulatory Visit | Attending: Internal Medicine | Admitting: Internal Medicine

## 2021-11-21 ENCOUNTER — Encounter (HOSPITAL_COMMUNITY): Payer: Self-pay

## 2021-11-21 DIAGNOSIS — R1013 Epigastric pain: Secondary | ICD-10-CM | POA: Diagnosis present

## 2021-11-21 DIAGNOSIS — I10 Essential (primary) hypertension: Secondary | ICD-10-CM | POA: Diagnosis not present

## 2021-11-21 DIAGNOSIS — E119 Type 2 diabetes mellitus without complications: Secondary | ICD-10-CM | POA: Diagnosis not present

## 2021-11-21 DIAGNOSIS — Z01812 Encounter for preprocedural laboratory examination: Secondary | ICD-10-CM | POA: Insufficient documentation

## 2021-11-21 DIAGNOSIS — K2980 Duodenitis without bleeding: Secondary | ICD-10-CM | POA: Diagnosis not present

## 2021-11-21 DIAGNOSIS — R112 Nausea with vomiting, unspecified: Secondary | ICD-10-CM | POA: Diagnosis not present

## 2021-11-21 DIAGNOSIS — K219 Gastro-esophageal reflux disease without esophagitis: Secondary | ICD-10-CM | POA: Diagnosis not present

## 2021-11-21 DIAGNOSIS — Z01818 Encounter for other preprocedural examination: Secondary | ICD-10-CM

## 2021-11-21 DIAGNOSIS — Z7984 Long term (current) use of oral hypoglycemic drugs: Secondary | ICD-10-CM | POA: Diagnosis not present

## 2021-11-21 DIAGNOSIS — J45909 Unspecified asthma, uncomplicated: Secondary | ICD-10-CM | POA: Diagnosis not present

## 2021-11-21 DIAGNOSIS — K2950 Unspecified chronic gastritis without bleeding: Secondary | ICD-10-CM | POA: Diagnosis not present

## 2021-11-21 DIAGNOSIS — M1990 Unspecified osteoarthritis, unspecified site: Secondary | ICD-10-CM | POA: Diagnosis not present

## 2021-11-21 HISTORY — DX: Anxiety disorder, unspecified: F41.9

## 2021-11-21 HISTORY — DX: Depression, unspecified: F32.A

## 2021-11-21 LAB — BASIC METABOLIC PANEL
Anion gap: 7 (ref 5–15)
BUN: 15 mg/dL (ref 6–20)
CO2: 24 mmol/L (ref 22–32)
Calcium: 10.4 mg/dL — ABNORMAL HIGH (ref 8.9–10.3)
Chloride: 109 mmol/L (ref 98–111)
Creatinine, Ser: 0.78 mg/dL (ref 0.44–1.00)
GFR, Estimated: >60 mL/min (ref >60)
Glucose, Bld: 102 mg/dL — ABNORMAL HIGH (ref 70–99)
Potassium: 3.4 mmol/L — ABNORMAL LOW (ref 3.5–5.1)
Sodium: 140 mmol/L (ref 135–145)

## 2021-11-24 ENCOUNTER — Encounter (HOSPITAL_COMMUNITY)
Admission: RE | Disposition: A | Discharge status: Home / Self Care | Payer: Self-pay | Source: Home / Self Care | Attending: Internal Medicine

## 2021-11-24 ENCOUNTER — Encounter (HOSPITAL_COMMUNITY): Payer: Self-pay

## 2021-11-24 ENCOUNTER — Ambulatory Visit (HOSPITAL_COMMUNITY): Payer: BC Managed Care – PPO | Admitting: Anesthesiology

## 2021-11-24 ENCOUNTER — Ambulatory Visit (HOSPITAL_COMMUNITY): Payer: BC Managed Care – PPO | Admitting: *Deleted

## 2021-11-24 ENCOUNTER — Ambulatory Visit (HOSPITAL_COMMUNITY)
Admission: RE | Admit: 2021-11-24 | Discharge: 2021-11-24 | Disposition: A | Discharge status: Home / Self Care | Payer: BC Managed Care – PPO | Attending: Internal Medicine | Admitting: Internal Medicine

## 2021-11-24 DIAGNOSIS — M1990 Unspecified osteoarthritis, unspecified site: Secondary | ICD-10-CM | POA: Insufficient documentation

## 2021-11-24 DIAGNOSIS — K2970 Gastritis, unspecified, without bleeding: Secondary | ICD-10-CM

## 2021-11-24 DIAGNOSIS — K2980 Duodenitis without bleeding: Secondary | ICD-10-CM | POA: Insufficient documentation

## 2021-11-24 DIAGNOSIS — R1013 Epigastric pain: Secondary | ICD-10-CM | POA: Insufficient documentation

## 2021-11-24 DIAGNOSIS — Z7984 Long term (current) use of oral hypoglycemic drugs: Secondary | ICD-10-CM | POA: Insufficient documentation

## 2021-11-24 DIAGNOSIS — I10 Essential (primary) hypertension: Secondary | ICD-10-CM | POA: Insufficient documentation

## 2021-11-24 DIAGNOSIS — J45909 Unspecified asthma, uncomplicated: Secondary | ICD-10-CM | POA: Insufficient documentation

## 2021-11-24 DIAGNOSIS — K2950 Unspecified chronic gastritis without bleeding: Secondary | ICD-10-CM | POA: Insufficient documentation

## 2021-11-24 DIAGNOSIS — K219 Gastro-esophageal reflux disease without esophagitis: Secondary | ICD-10-CM | POA: Insufficient documentation

## 2021-11-24 DIAGNOSIS — R109 Unspecified abdominal pain: Secondary | ICD-10-CM

## 2021-11-24 DIAGNOSIS — E119 Type 2 diabetes mellitus without complications: Secondary | ICD-10-CM | POA: Insufficient documentation

## 2021-11-24 DIAGNOSIS — R112 Nausea with vomiting, unspecified: Secondary | ICD-10-CM | POA: Insufficient documentation

## 2021-11-24 HISTORY — PX: BIOPSY: SHX5522

## 2021-11-24 HISTORY — PX: ESOPHAGOGASTRODUODENOSCOPY (EGD) WITH PROPOFOL: SHX5813

## 2021-11-24 LAB — GLUCOSE, CAPILLARY: Glucose-Capillary: 103 mg/dL — ABNORMAL HIGH (ref 70–99)

## 2021-11-24 SURGERY — ESOPHAGOGASTRODUODENOSCOPY (EGD) WITH PROPOFOL
Anesthesia: General

## 2021-11-24 MED ORDER — LACTATED RINGERS IV SOLN: INTRAVENOUS | Status: DC | PRN

## 2021-11-24 MED ORDER — LIDOCAINE HCL (CARDIAC) PF 100 MG/5ML IV SOSY
PREFILLED_SYRINGE | INTRAVENOUS | Status: DC | PRN
  Administered 2021-11-24: 100 mg via INTRAVENOUS

## 2021-11-24 MED ORDER — LACTATED RINGERS IV SOLN
INTRAVENOUS | Status: DC
  Administered 2021-11-24: 1000 mL via INTRAVENOUS

## 2021-11-24 MED ORDER — PROPOFOL 10 MG/ML IV BOLUS
INTRAVENOUS | Status: DC | PRN
  Administered 2021-11-24: 100 mg via INTRAVENOUS

## 2021-11-24 MED ORDER — PROPOFOL 500 MG/50ML IV EMUL
INTRAVENOUS | Status: DC | PRN
  Administered 2021-11-24: 150 ug/kg/min via INTRAVENOUS

## 2021-11-24 NOTE — Discharge Instructions (Addendum)
EGD Discharge instructions Please read the instructions outlined below and refer to this sheet in the next few weeks. These discharge instructions provide you with general information on caring for yourself after you leave the hospital. Your doctor may also give you specific instructions. While your treatment has been planned according to the most current medical practices available, unavoidable complications occasionally occur. If you have any problems or questions after discharge, please call your doctor. ACTIVITY You may resume your regular activity but move at a slower pace for the next 24 hours.  Take frequent rest periods for the next 24 hours.  Walking will help expel (get rid of) the air and reduce the bloated feeling in your abdomen.  No driving for 24 hours (because of the anesthesia (medicine) used during the test).  You may shower.  Do not sign any important legal documents or operate any machinery for 24 hours (because of the anesthesia used during the test).  NUTRITION Drink plenty of fluids.  You may resume your normal diet.  Begin with a light meal and progress to your normal diet.  Avoid alcoholic beverages for 24 hours or as instructed by your caregiver.  MEDICATIONS You may resume your normal medications unless your caregiver tells you otherwise.  WHAT YOU CAN EXPECT TODAY You may experience abdominal discomfort such as a feeling of fullness or "gas" pains.  FOLLOW-UP Your doctor will discuss the results of your test with you.  SEEK IMMEDIATE MEDICAL ATTENTION IF ANY OF THE FOLLOWING OCCUR: Excessive nausea (feeling sick to your stomach) and/or vomiting.  Severe abdominal pain and distention (swelling).  Trouble swallowing.  Temperature over 101 F (37.8 C).  Rectal bleeding or vomiting of blood.   Your EGD revealed mild amount inflammation in your stomach.  I took biopsies of this to rule out infection with a bacteria called H. pylori.  Await pathology results, my  office will contact you.  Your esophagus appeared normal.  Mild inflammation in your small bowel.  I want you to start taking your Movantik every morning.  This medication works best if you take it on a piece stomach 1 hour before meal.  Follow-up with Dr. Abbey Hill in 2 to 3 months.  Virtual visit okay if you prefer.   I hope you have a great rest of your week!  Gina Hill. Gina Hill, D.O. Gastroenterology and Hepatology [facility]

## 2021-11-24 NOTE — Op Note (Signed)
[facility] Patient Name: Gina Hill Procedure Date: 11/24/2021 8:52 AM MRN: [id number] Date of Birth: June 20, 1960 Attending MD: Elon Alas. Abbey Chatters , [place], [id number] CSN: [id number] Age: 61 Admit Type: Outpatient Procedure:                Upper GI endoscopy Indications:              Epigastric abdominal pain, Nausea with vomiting Providers:                Elon Alas. Abbey Chatters, DO, Janeece Riggers, RN, Kristine L.                            Risa Grill, Technician Referring MD:              Medicines:                See the Anesthesia note for documentation of the                            administered medications Complications:            No immediate complications. Estimated Blood Loss:     Estimated blood loss was minimal. Procedure:                Pre-Anesthesia Assessment:                           - The anesthesia plan was to use monitored                            anesthesia care (MAC).                           After obtaining informed consent, the endoscope was                            passed under direct vision. Throughout the                            procedure, the patient's blood pressure, pulse, and                            oxygen saturations were monitored continuously. The                            GIF-H190 ([id number]) scope was introduced through the                            mouth, and advanced to the second part of duodenum.                            The upper GI endoscopy was accomplished without                            difficulty. The patient tolerated the procedure  well. Scope In: 9:09:18 AM Scope Out: 9:12:45 AM Total Procedure Duration: 0 hours 3 minutes 27 seconds  Findings:      The Z-line was regular and was found 41 cm from the incisors.      Patchy mild inflammation characterized by erythema was found in the       gastric body and in the gastric antrum. Biopsies were taken with a cold       forceps for Helicobacter  pylori testing.      Localized mild inflammation characterized by erythema was found in the       duodenal bulb.      The second portion of the duodenum and third portion of the duodenum       were normal. Impression:               - Z-line regular, 41 cm from the incisors.                           - Gastritis. Biopsied.                           - Duodenitis.                           - Normal second portion of the duodenum and third                            portion of the duodenum. Moderate Sedation:      Per Anesthesia Care Recommendation:           - Patient has a contact number available for                            emergencies. The signs and symptoms of potential                            delayed complications were discussed with the                            patient. Return to normal activities tomorrow.                            Written discharge instructions were provided to the                            patient.                           - Resume previous diet.                           - Continue present medications.                           - Await pathology results.                           - Return to GI clinic in 3 months. Procedure Code(s):        ---  Professional ---                           [phone], Esophagogastroduodenoscopy, flexible,                            transoral; with biopsy, single or multiple Diagnosis Code(s):        --- Professional ---                           K29.70, Gastritis, unspecified, without bleeding                           K29.80, Duodenitis without bleeding                           R10.13, Epigastric pain                           R11.2, Nausea with vomiting, unspecified CPT copyright 2022 American [employer] Association. All rights reserved. The codes documented in this report are preliminary and upon coder review may  be revised to meet current compliance requirements. Elon Alas. Abbey Chatters, DO [place] Abbey Chatters, DO 11/24/2021  9:15:56 AM This report has been signed electronically. Number of Addenda: 0

## 2021-11-24 NOTE — Anesthesia Preprocedure Evaluation (Signed)
Anesthesia Evaluation  Patient identified by MRN, date of birth, ID band Patient awake    Reviewed: Allergy & Precautions, H&P , NPO status , Patient's Chart, lab work & pertinent test results, reviewed documented beta blocker date and time   Airway Mallampati: II  TM Distance: >3 FB Neck ROM: Full    Dental  (+) Dental Advisory Given, Loose, Missing, Poor Dentition, Chipped,    Pulmonary asthma , pneumonia   Pulmonary exam normal breath sounds clear to auscultation       Cardiovascular Exercise Tolerance: Good hypertension, Pt. on medications and Pt. on home beta blockers Normal cardiovascular exam Rhythm:Regular Rate:Normal     Neuro/Psych negative neurological ROS  negative psych ROS   GI/Hepatic Neg liver ROS,GERD (on GLP1 drugs)  Poorly Controlled,,  Endo/Other  diabetes, Well Controlled, Oral Hypoglycemic Agents    Renal/GU negative Renal ROS  negative genitourinary   Musculoskeletal  (+) Arthritis , Osteoarthritis,    Abdominal   Peds negative pediatric ROS (+)  Hematology negative hematology ROS (+)   Anesthesia Other Findings   Reproductive/Obstetrics negative OB ROS                             Anesthesia Physical Anesthesia Plan  ASA: 2  Anesthesia Plan: General   Post-op Pain Management: Minimal or no pain anticipated   Induction: Intravenous  PONV Risk Score and Plan: Propofol infusion  Airway Management Planned: Nasal Cannula and Natural Airway  Additional Equipment:   Intra-op Plan:   Post-operative Plan:   Informed Consent: I have reviewed the patients History and Physical, chart, labs and discussed the procedure including the risks, benefits and alternatives for the proposed anesthesia with the patient or authorized representative who has indicated his/her understanding and acceptance.     Dental advisory given  Plan Discussed with: CRNA and  Surgeon  Anesthesia Plan Comments:        Anesthesia Quick Evaluation

## 2021-11-24 NOTE — Anesthesia Postprocedure Evaluation (Signed)
Anesthesia Post Note  Patient: Production designer, theatre/television/film  Procedure(s) Performed: ESOPHAGOGASTRODUODENOSCOPY (EGD) WITH PROPOFOL BIOPSY  Patient location during evaluation: Phase II Anesthesia Type: General Level of consciousness: awake and alert and oriented Pain management: pain level controlled Vital Signs Assessment: post-procedure vital signs reviewed and stable Respiratory status: spontaneous breathing, nonlabored ventilation and respiratory function stable Cardiovascular status: blood pressure returned to baseline and stable Postop Assessment: no apparent nausea or vomiting Anesthetic complications: no  No notable events documented.   Last Vitals:  Vitals:   11/24/21 0825 11/24/21 0918  BP: (!) 164/75 136/86  Pulse:  85  Resp: 16 16  Temp: 36.7 C   SpO2: 100% 96%    Last Pain:  Vitals:   11/24/21 0918  TempSrc: Oral  PainSc: 0-No pain                 Terissa Haffey C Birdena Kingma

## 2021-11-24 NOTE — H&P (Signed)
Primary Care Physician:  Jetta Lout Primary Gastroenterologist:  Dr. Abbey Chatters  Pre-Procedure History & Physical: HPI:  Gina Hill is a 61 y.o. female is here for an EGD to be performed for nausea vomiting, epigastric pain.  Past Medical History:  Diagnosis Date   Anxiety    Arthritis    Asthma    Cancer ([place])    right breast   Chronic back pain    Chronic pelvic pain in female    Depression    Diabetes mellitus without complication (HCC)    Endometriosis    GERD (gastroesophageal reflux disease)    Hypercholesterolemia    Hyperparathyroidism (HCC)    Hypertension    Neuropathic pain    Obese     Past Surgical History:  Procedure Laterality Date   BREAST BIOPSY     Right   CESAREAN SECTION     FOOT SURGERY     Left foot   LAPAROSCOPIC LYSIS OF ADHESIONS     LAPAROTOMY     PORTACATH PLACEMENT Left 06/22/2013   Procedure: INSERTION PORT-A-CATH;  Surgeon: Merrie Roof, MD;  Location: [facility];  Service: General;  Laterality: Left;   SALPINGECTOMY     Partial Right     WISDOM TOOTH EXTRACTION      Prior to Admission medications   Medication Sig Start Date End Date Taking? Authorizing Provider  diclofenac sodium (VOLTAREN) 1 % GEL Apply 2 g topically 2 (two) times daily as needed (to painful joint).   Yes [provider]  metoprolol succinate (TOPROL-XL) 50 MG 24 hr tablet Take 50 mg by mouth daily. Take with or immediately following a meal.   Yes [provider]  Multiple Vitamins-Minerals (MULTIVITAMIN PO) Take 1 tablet by mouth daily.   Yes [provider]  naloxegol oxalate (MOVANTIK) 25 MG TABS tablet Take 1 tablet (25 mg total) by mouth daily. Take on empty stomach 1 hr before first meal 11/13/21 11/13/22 Yes Makayah Pauli K, DO  oxybutynin (DITROPAN-XL) 10 MG 24 hr tablet Take 10 mg by mouth daily.   Yes [provider]  pregabalin (LYRICA) 150 MG capsule Take 150 mg by mouth 4 (four) times daily.   Yes [provider]  tiZANidine (ZANAFLEX) 2 MG tablet Take 2 mg by mouth at bedtime. 08/22/21  Yes [provider]  TRULICITY 1.5 OF/7.5ZW SOPN Inject 1.5 mg into the skin once a week. 08/19/21   [provider]    Allergies as of 11/18/2021 - Review Complete 11/13/2021  Allergen Reaction Noted   Gadolinium derivatives Other (See Comments) 06/02/2016   Iodine Diarrhea 07/11/2014   Medroxyprogesterone Nausea And Vomiting and Other (See Comments) 08/14/2013   Tape Other (See Comments) 10/10/2013   Codeine Nausea And Vomiting 02/26/2012    Family History  Problem Relation Age of Onset   Hypertension Mother    Mental illness Mother    Heart disease Mother    Diabetes Father    Hypertension Father    Cancer Father        prostate   Kidney disease Father    Breast cancer Maternal Aunt    Cancer Maternal Aunt        kidney    Social History   Socioeconomic History   Marital status: Married    Spouse name: Not on file   Number of children: Not on file   Years of education: Not on file   Highest education level: Not on file  Occupational History  Not on file  Tobacco Use   Smoking status: Never   Smokeless tobacco: Never  Vaping Use   Vaping Use: Never used  Substance and Sexual Activity   Alcohol use: No   Drug use: Never    Types: Oxycodone   Sexual activity: Not Currently    Birth control/protection: None  Other Topics Concern   Not on file  Social History Narrative   Not on file   Social Determinants of Health   Financial Resource Strain: Not on file  Food Insecurity: Not on file  Transportation Needs: Not on file  Physical Activity: Not on file  Stress: Not on file  Social Connections: Not on file  Intimate Partner Violence: Not on file    Review of Systems: General: Negative for fever, chills, fatigue, weakness. Eyes: Negative for vision changes.  ENT: Negative for hoarseness, difficulty swallowing , nasal congestion. CV: Negative for chest pain,  angina, palpitations, dyspnea on exertion, peripheral edema.  Respiratory: Negative for dyspnea at rest, dyspnea on exertion, cough, sputum, wheezing.  GI: See history of present illness. GU:  Negative for dysuria, hematuria, urinary incontinence, urinary frequency, nocturnal urination.  MS: Negative for joint pain, low back pain.  Derm: Negative for rash or itching.  Neuro: Negative for weakness, abnormal sensation, seizure, frequent headaches, memory loss, confusion.  Psych: Negative for anxiety, depression Endo: Negative for unusual weight change.  Heme: Negative for bruising or bleeding. Allergy: Negative for rash or hives.  Physical Exam: Vital signs in last 24 hours: Temp:  [98.1 F (36.7 C)] 98.1 F (36.7 C) (11/06 0825) Resp:  [16] 16 (11/06 0825) BP: (164)/(75) 164/75 (11/06 0825) SpO2:  [100 %] 100 % (11/06 0825)   General:   Alert,  Well-developed, well-nourished, pleasant and cooperative in NAD Head:  Normocephalic and atraumatic. Eyes:  Sclera clear, no icterus.   Conjunctiva pink. Ears:  Normal auditory acuity. Nose:  No deformity, discharge,  or lesions. Mouth:  No deformity or lesions, dentition normal. Neck:  Supple; no masses or thyromegaly. Lungs:  Clear throughout to auscultation.   No wheezes, crackles, or rhonchi. No acute distress. Heart:  Regular rate and rhythm; no murmurs, clicks, rubs,  or gallops. Abdomen:  Soft, nontender and nondistended. No masses, hepatosplenomegaly or hernias noted. Normal bowel sounds, without guarding, and without rebound.   Msk:  Symmetrical without gross deformities. Normal posture. Extremities:  Without clubbing or edema. Neurologic:  Alert and  oriented x4;  grossly normal neurologically. Skin:  Intact without significant lesions or rashes. Cervical Nodes:  No significant cervical adenopathy. Psych:  Alert and cooperative. Normal mood and affect.   Impression/Plan: Gina Hill is here for an EGD to be performed for  nausea vomiting, epigastric pain.  Risks, benefits, limitations, imponderables and alternatives regarding EGD have been reviewed with the patient. Questions have been answered. All parties agreeable.

## 2021-11-24 NOTE — Transfer of Care (Signed)
Immediate Anesthesia Transfer of Care Note  Patient: Gina Hill  Procedure(s) Performed: ESOPHAGOGASTRODUODENOSCOPY (EGD) WITH PROPOFOL BIOPSY  Patient Location: Short Stay  Anesthesia Type:General  Level of Consciousness: awake, alert , oriented, and patient cooperative  Airway & Oxygen Therapy: Patient Spontanous Breathing  Post-op Assessment: Report given to RN, Post -op Vital signs reviewed and stable, and Patient moving all extremities X 4  Post vital signs: Reviewed and stable  Last Vitals:  Vitals Value Taken Time  BP 136/86 11/24/21 0918  Temp    Pulse 85 11/24/21 0918  Resp 16 11/24/21 0918  SpO2 96 % 11/24/21 0918    Last Pain:  Vitals:   11/24/21 0918  TempSrc: Oral  PainSc: 0-No pain      Patients Stated Pain Goal: 8 (33/38/32 [id number])  Complications: No notable events documented.

## 2021-11-25 LAB — SURGICAL PATHOLOGY

## 2021-11-26 ENCOUNTER — Telehealth: Payer: Self-pay | Admitting: Internal Medicine

## 2021-11-26 MED ORDER — PANTOPRAZOLE SODIUM 40 MG PO TBEC: 40.0000 mg | DELAYED_RELEASE_TABLET | Freq: Every day | ORAL | 11 refills | Status: AC

## 2021-11-26 NOTE — Telephone Encounter (Signed)
Phoned and advised the pt of her result note and instructions to her medications. Pt expressed understanding

## 2021-11-26 NOTE — Telephone Encounter (Signed)
Please let patient know gastric biopsies showed inflammation, negative for H. pylori.  I am going to start a new medication called pantoprazole 40 mg daily to see if this helps with her symptoms.  I have sent this to her pharmacy.  Follow-up with GI as previously scheduled.  Thank you

## 2021-12-01 ENCOUNTER — Encounter (HOSPITAL_COMMUNITY): Payer: Self-pay | Admitting: Internal Medicine

## 2022-01-29 ENCOUNTER — Encounter: Payer: Self-pay | Admitting: Internal Medicine

## 2023-07-31 IMAGING — DX DG CHEST 1V PORT
1 series · 1 of 1 positions shown · non-contrast
Comparison: October 03, 2018

CLINICAL DATA: Left-sided chest pain.

EXAM:
PORTABLE CHEST 1 VIEW

[chest ap]
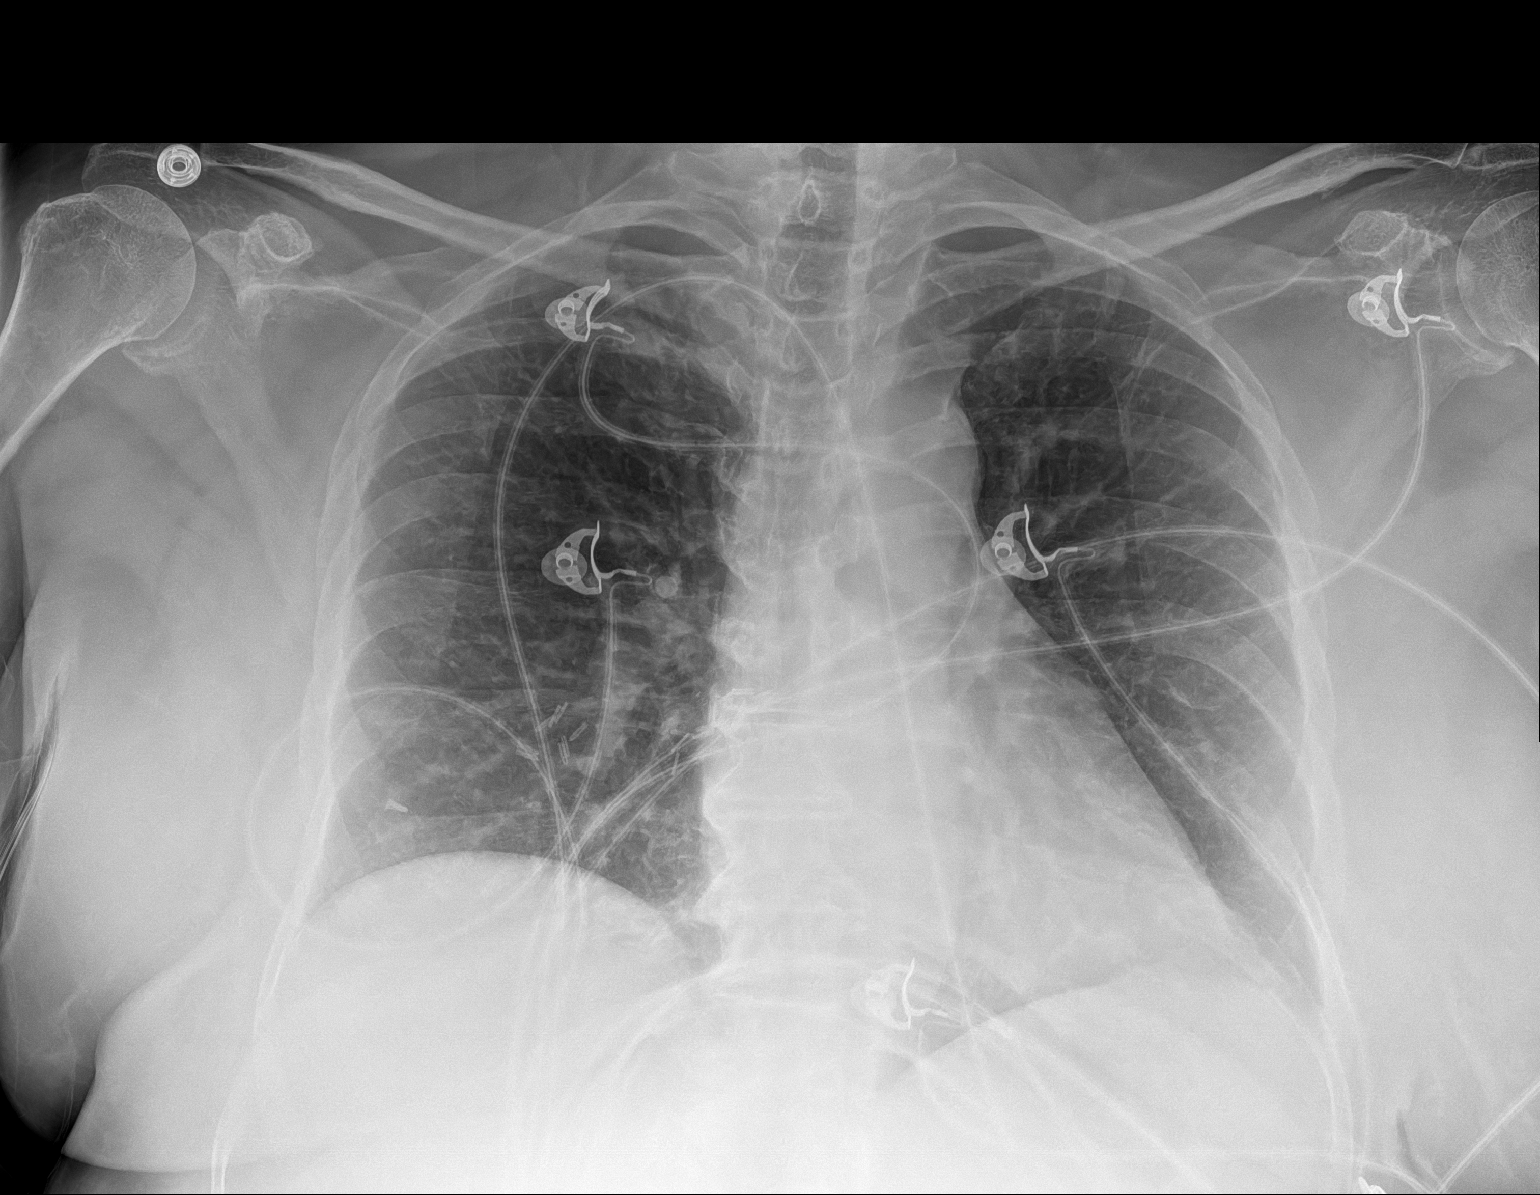

[1 of 1 positions shown; findings below may reference images not displayed]

FINDINGS: The heart size and mediastinal contours are within normal limits.
Both lungs are clear. Radiopaque surgical clips are seen overlying
the lateral aspect of the right lung base. Multilevel degenerative
changes are seen throughout the thoracic spine.
IMPRESSION: No active disease.
